# Patient Record
Sex: Male | Born: 1935 | Race: Black or African American | Hispanic: No | Smoking: Former smoker
Health system: Southern US, Community
[De-identification: ages and names within clinical notes are randomized; demographics above are authoritative.]

## PROBLEM LIST (undated history)

## (undated) DIAGNOSIS — I519 Heart disease, unspecified: Secondary | ICD-10-CM

## (undated) DIAGNOSIS — F0390 Unspecified dementia without behavioral disturbance: Secondary | ICD-10-CM

## (undated) DIAGNOSIS — N400 Enlarged prostate without lower urinary tract symptoms: Secondary | ICD-10-CM

## (undated) DIAGNOSIS — R55 Syncope and collapse: Secondary | ICD-10-CM

## (undated) DIAGNOSIS — F419 Anxiety disorder, unspecified: Secondary | ICD-10-CM

## (undated) DIAGNOSIS — S065XAA Traumatic subdural hemorrhage with loss of consciousness status unknown, initial encounter: Secondary | ICD-10-CM

## (undated) DIAGNOSIS — I4891 Unspecified atrial fibrillation: Secondary | ICD-10-CM

## (undated) DIAGNOSIS — S065X9A Traumatic subdural hemorrhage with loss of consciousness of unspecified duration, initial encounter: Secondary | ICD-10-CM

## (undated) DIAGNOSIS — A4902 Methicillin resistant Staphylococcus aureus infection, unspecified site: Secondary | ICD-10-CM

## (undated) HISTORY — DX: Heart disease, unspecified: I51.9

## (undated) HISTORY — DX: Methicillin resistant Staphylococcus aureus infection, unspecified site: A49.02

## (undated) HISTORY — DX: Syncope and collapse: R55

## (undated) HISTORY — PX: CORONARY ARTERY BYPASS GRAFT: SHX141

## (undated) HISTORY — PX: HERNIA REPAIR: SHX51

---

## 2018-04-22 ENCOUNTER — Other Ambulatory Visit: Payer: Self-pay

## 2018-04-22 ENCOUNTER — Inpatient Hospital Stay: Payer: Medicare Other

## 2018-04-22 ENCOUNTER — Emergency Department: Payer: Medicare Other

## 2018-04-22 ENCOUNTER — Encounter: Payer: Self-pay | Admitting: Emergency Medicine

## 2018-04-22 ENCOUNTER — Inpatient Hospital Stay
Admission: EM | Admit: 2018-04-22 | Discharge: 2018-04-25 | DRG: 083 | Disposition: A | Discharge status: Skilled Nursing Facility | Payer: Medicare Other | Source: Skilled Nursing Facility | Attending: Internal Medicine | Admitting: Internal Medicine

## 2018-04-22 DIAGNOSIS — F329 Major depressive disorder, single episode, unspecified: Secondary | ICD-10-CM | POA: Diagnosis present

## 2018-04-22 DIAGNOSIS — Z66 Do not resuscitate: Secondary | ICD-10-CM | POA: Diagnosis present

## 2018-04-22 DIAGNOSIS — N400 Enlarged prostate without lower urinary tract symptoms: Secondary | ICD-10-CM | POA: Diagnosis present

## 2018-04-22 DIAGNOSIS — W19XXXA Unspecified fall, initial encounter: Secondary | ICD-10-CM | POA: Diagnosis present

## 2018-04-22 DIAGNOSIS — Z7901 Long term (current) use of anticoagulants: Secondary | ICD-10-CM

## 2018-04-22 DIAGNOSIS — K5901 Slow transit constipation: Secondary | ICD-10-CM | POA: Diagnosis present

## 2018-04-22 DIAGNOSIS — Z7189 Other specified counseling: Secondary | ICD-10-CM | POA: Diagnosis not present

## 2018-04-22 DIAGNOSIS — S065X9A Traumatic subdural hemorrhage with loss of consciousness of unspecified duration, initial encounter: Principal | ICD-10-CM | POA: Diagnosis present

## 2018-04-22 DIAGNOSIS — I2510 Atherosclerotic heart disease of native coronary artery without angina pectoris: Secondary | ICD-10-CM | POA: Diagnosis present

## 2018-04-22 DIAGNOSIS — M4692 Unspecified inflammatory spondylopathy, cervical region: Secondary | ICD-10-CM | POA: Diagnosis present

## 2018-04-22 DIAGNOSIS — Z6825 Body mass index (BMI) 25.0-25.9, adult: Secondary | ICD-10-CM

## 2018-04-22 DIAGNOSIS — Z23 Encounter for immunization: Secondary | ICD-10-CM

## 2018-04-22 DIAGNOSIS — I629 Nontraumatic intracranial hemorrhage, unspecified: Secondary | ICD-10-CM

## 2018-04-22 DIAGNOSIS — R414 Neurologic neglect syndrome: Secondary | ICD-10-CM | POA: Diagnosis present

## 2018-04-22 DIAGNOSIS — Z515 Encounter for palliative care: Secondary | ICD-10-CM | POA: Diagnosis present

## 2018-04-22 DIAGNOSIS — Y92099 Unspecified place in other non-institutional residence as the place of occurrence of the external cause: Secondary | ICD-10-CM

## 2018-04-22 DIAGNOSIS — E875 Hyperkalemia: Secondary | ICD-10-CM | POA: Diagnosis present

## 2018-04-22 DIAGNOSIS — R296 Repeated falls: Secondary | ICD-10-CM | POA: Diagnosis present

## 2018-04-22 DIAGNOSIS — F03918 Unspecified dementia, unspecified severity, with other behavioral disturbance: Secondary | ICD-10-CM

## 2018-04-22 DIAGNOSIS — S01112A Laceration without foreign body of left eyelid and periocular area, initial encounter: Secondary | ICD-10-CM | POA: Diagnosis present

## 2018-04-22 DIAGNOSIS — S0990XA Unspecified injury of head, initial encounter: Secondary | ICD-10-CM

## 2018-04-22 DIAGNOSIS — F0391 Unspecified dementia with behavioral disturbance: Secondary | ICD-10-CM | POA: Diagnosis present

## 2018-04-22 DIAGNOSIS — I4891 Unspecified atrial fibrillation: Secondary | ICD-10-CM | POA: Diagnosis present

## 2018-04-22 DIAGNOSIS — N183 Chronic kidney disease, stage 3 (moderate): Secondary | ICD-10-CM | POA: Diagnosis present

## 2018-04-22 DIAGNOSIS — N179 Acute kidney failure, unspecified: Secondary | ICD-10-CM | POA: Diagnosis present

## 2018-04-22 DIAGNOSIS — R634 Abnormal weight loss: Secondary | ICD-10-CM | POA: Diagnosis present

## 2018-04-22 DIAGNOSIS — S065XAA Traumatic subdural hemorrhage with loss of consciousness status unknown, initial encounter: Secondary | ICD-10-CM

## 2018-04-22 DIAGNOSIS — S40012A Contusion of left shoulder, initial encounter: Secondary | ICD-10-CM | POA: Diagnosis present

## 2018-04-22 LAB — CBC WITH DIFFERENTIAL/PLATELET
BASOS ABS: 0 10*3/uL (ref 0–0.1)
Basophils Relative: 1 %
Eosinophils Absolute: 0.1 10*3/uL (ref 0–0.7)
Eosinophils Relative: 2 %
HEMATOCRIT: 35.5 % — AB (ref 40.0–52.0)
HEMOGLOBIN: 12 g/dL — AB (ref 13.0–18.0)
LYMPHS PCT: 19 %
Lymphs Abs: 1.2 10*3/uL (ref 1.0–3.6)
MCH: 35.8 pg — ABNORMAL HIGH (ref 26.0–34.0)
MCHC: 33.9 g/dL (ref 32.0–36.0)
MCV: 105.8 fL — AB (ref 80.0–100.0)
MONO ABS: 0.6 10*3/uL (ref 0.2–1.0)
Monocytes Relative: 9 %
NEUTROS ABS: 4.3 10*3/uL (ref 1.4–6.5)
NEUTROS PCT: 69 %
Platelets: 121 10*3/uL — ABNORMAL LOW (ref 150–440)
RBC: 3.35 MIL/uL — AB (ref 4.40–5.90)
RDW: 14 % (ref 11.5–14.5)
WBC: 6.2 10*3/uL (ref 3.8–10.6)

## 2018-04-22 LAB — BASIC METABOLIC PANEL
Anion gap: 6 (ref 5–15)
BUN: 18 mg/dL (ref 8–23)
CHLORIDE: 108 mmol/L (ref 98–111)
CO2: 27 mmol/L (ref 22–32)
Calcium: 8.8 mg/dL — ABNORMAL LOW (ref 8.9–10.3)
Creatinine, Ser: 1.77 mg/dL — ABNORMAL HIGH (ref 0.61–1.24)
GFR calc non Af Amer: 34 mL/min — ABNORMAL LOW (ref >60)
GFR, EST AFRICAN AMERICAN: 40 mL/min — AB (ref >60)
GLUCOSE: 118 mg/dL — AB (ref 70–99)
POTASSIUM: 5.2 mmol/L — AB (ref 3.5–5.1)
Sodium: 141 mmol/L (ref 135–145)

## 2018-04-22 LAB — TYPE AND SCREEN
ABO/RH(D): A POS
ANTIBODY SCREEN: NEGATIVE

## 2018-04-22 LAB — PROTIME-INR
INR: 1.11
Prothrombin Time: 14.2 seconds (ref 11.4–15.2)

## 2018-04-22 LAB — HEPARIN LEVEL (UNFRACTIONATED): HEPARIN UNFRACTIONATED: 0.81 [IU]/mL — AB (ref 0.30–0.70)

## 2018-04-22 LAB — APTT: APTT: 34 s (ref 24–36)

## 2018-04-22 MED ORDER — ATORVASTATIN CALCIUM 20 MG PO TABS
20.0000 mg | ORAL_TABLET | Freq: Every day | ORAL | Status: DC
  Administered 2018-04-23: 09:00:00 20 mg via ORAL
  Filled 2018-04-22: qty 1

## 2018-04-22 MED ORDER — HYDRALAZINE HCL 10 MG PO TABS
10.0000 mg | ORAL_TABLET | Freq: Three times a day (TID) | ORAL | Status: DC
Start: 2018-04-22 — End: 2018-04-23
  Administered 2018-04-22 – 2018-04-23 (×2): 10 mg via ORAL
  Filled 2018-04-22 (×4): qty 1

## 2018-04-22 MED ORDER — SENNOSIDES-DOCUSATE SODIUM 8.6-50 MG PO TABS: 1.0000 | ORAL_TABLET | Freq: Every evening | ORAL | Status: DC | PRN

## 2018-04-22 MED ORDER — TAMSULOSIN HCL 0.4 MG PO CAPS
0.4000 mg | ORAL_CAPSULE | Freq: Every day | ORAL | Status: DC
  Administered 2018-04-24 – 2018-04-25 (×2): 0.4 mg via ORAL
  Filled 2018-04-22 (×2): qty 1

## 2018-04-22 MED ORDER — MEMANTINE HCL ER 28 MG PO CP24
28.0000 mg | ORAL_CAPSULE | Freq: Every day | ORAL | Status: DC
  Administered 2018-04-23 – 2018-04-25 (×3): 28 mg via ORAL
  Filled 2018-04-22 (×3): qty 1

## 2018-04-22 MED ORDER — ONDANSETRON HCL 4 MG PO TABS
4.0000 mg | ORAL_TABLET | Freq: Four times a day (QID) | ORAL | Status: DC | PRN
Start: 2018-04-22 — End: 2018-04-25

## 2018-04-22 MED ORDER — AMLODIPINE BESYLATE 10 MG PO TABS
10.0000 mg | ORAL_TABLET | Freq: Every day | ORAL | Status: DC
Start: 2018-04-23 — End: 2018-04-23
  Administered 2018-04-23: 09:00:00 10 mg via ORAL
  Filled 2018-04-22: qty 1

## 2018-04-22 MED ORDER — TETANUS-DIPHTH-ACELL PERTUSSIS 5-2.5-18.5 LF-MCG/0.5 IM SUSP
0.5000 mL | Freq: Once | INTRAMUSCULAR | Status: AC
  Administered 2018-04-22: 0.5 mL via INTRAMUSCULAR
  Filled 2018-04-22: qty 0.5

## 2018-04-22 MED ORDER — HYDROCODONE-ACETAMINOPHEN 5-325 MG PO TABS: 1.0000 | ORAL_TABLET | ORAL | Status: DC | PRN

## 2018-04-22 MED ORDER — MIRTAZAPINE 15 MG PO TABS
7.5000 mg | ORAL_TABLET | Freq: Every day | ORAL | Status: DC
  Administered 2018-04-22 – 2018-04-24 (×3): 7.5 mg via ORAL
  Filled 2018-04-22 (×3): qty 1

## 2018-04-22 MED ORDER — MORPHINE SULFATE (PF) 2 MG/ML IV SOLN
2.0000 mg | INTRAVENOUS | Status: DC | PRN
  Filled 2018-04-22: qty 1

## 2018-04-22 MED ORDER — ALBUTEROL SULFATE (2.5 MG/3ML) 0.083% IN NEBU: 2.5000 mg | INHALATION_SOLUTION | RESPIRATORY_TRACT | Status: DC | PRN

## 2018-04-22 MED ORDER — ACETAMINOPHEN 325 MG PO TABS: 650.0000 mg | ORAL_TABLET | Freq: Four times a day (QID) | ORAL | Status: DC | PRN

## 2018-04-22 MED ORDER — ACETAMINOPHEN 650 MG RE SUPP: 650.0000 mg | Freq: Four times a day (QID) | RECTAL | Status: DC | PRN

## 2018-04-22 MED ORDER — ONDANSETRON HCL 4 MG/2ML IJ SOLN: 4.0000 mg | Freq: Four times a day (QID) | INTRAMUSCULAR | Status: DC | PRN

## 2018-04-22 MED ORDER — DONEPEZIL HCL 5 MG PO TABS
10.0000 mg | ORAL_TABLET | Freq: Every day | ORAL | Status: DC
Start: 2018-04-22 — End: 2018-04-25
  Administered 2018-04-22 – 2018-04-24 (×3): 10 mg via ORAL
  Filled 2018-04-22 (×4): qty 2

## 2018-04-22 MED ORDER — LORAZEPAM 2 MG/ML IJ SOLN: 1.0000 mg | INTRAMUSCULAR | Status: DC | PRN

## 2018-04-22 MED ORDER — BISACODYL 5 MG PO TBEC: 5.0000 mg | DELAYED_RELEASE_TABLET | Freq: Every day | ORAL | Status: DC | PRN

## 2018-04-22 MED ORDER — DIVALPROEX SODIUM 125 MG PO CSDR
250.0000 mg | DELAYED_RELEASE_CAPSULE | Freq: Two times a day (BID) | ORAL | Status: DC
  Administered 2018-04-22 – 2018-04-25 (×6): 250 mg via ORAL
  Filled 2018-04-22 (×7): qty 2

## 2018-04-22 MED ORDER — LORAZEPAM BOLUS VIA INFUSION: 1.0000 mg | INTRAVENOUS | Status: DC | PRN

## 2018-04-22 MED ORDER — EMPTY CONTAINERS FLEXIBLE MISC
4500.0000 [IU] | Status: AC
  Administered 2018-04-22: 4500 [IU] via INTRAVENOUS
  Filled 2018-04-22: qty 4000

## 2018-04-22 NOTE — ED Triage Notes (Signed)
Pt arrived via [facility]. Pt is from [facility]. Per EMS, pt fell at 1300 today. Laceration noted on L/eyebrown.   Hx of dementia, pt at baseline.  VSS per EMS. NAD. ED provider at bedside.

## 2018-04-22 NOTE — ED Notes (Signed)
Called to give report, they stated that room was still being clean and nurse would call back and get report.

## 2018-04-22 NOTE — Clinical Social Work Note (Addendum)
Clinical Social Work Assessment  Patient Details  Name: Keith [name] MRN: [id number] Date of Birth: [birth year]  Date of referral:  04/22/18               Reason for consult:  Facility Placement                Permission sought to share information with:  Family Supports, Customer service manager Permission granted to share information::  Yes, Verbal Permission Granted(Patient with altered mental status)  Name::     Leandra Kern  Agency::  [facility]  Relationship::  Daughter  Contact Information:  [phone]  Housing/Transportation Living arrangements for the past 2 months:  Assisted Living Facility([facility]) Source of Information:  Adult Children(Daughter -Leandra Kern) Patient Interpreter Needed:  None Criminal Activity/Legal Involvement Pertinent to Current Situation/Hospitalization:  No - Comment as needed Significant Relationships:  Adult Children Lives with:  Facility Resident Do you feel safe going back to the place where you live?  Yes Need for family participation in patient care:  Yes (Comment)(Patient with altered mental status)  Care giving concerns:  Patient lives at [facility] and needing a higher level of care.    Social Worker assessment / plan:  CSW received consult for "placement." Patient is a current resident at [facility] (ALF). Patient came in for a fall today. CSW staffed with EDP Dr. Quentin Cornwall and patient to be admitted. CSW met with patient and daughter at bedside. Patient with altered mental status and attempting to get out of bed while CSW completing assessment. Daughter states she moved patient here from [place] around May 28th or 29th and placed patient into [facility]. Daughter states patient is unable to get [facility] Medicaid until he has been a resident here for 81 months (August 28th or 29th). Daughter states she has been speaking with Marden Noble at  [facility] about patient moving there. However, patient cannot pay $3800/month privately. Currently patient receives approximately $1500/month and is paying privately at current ALF, with daughter supplementing cost of care. Daughter stated she would not be able to supplement cost of care for memory care. Daughter is the only local support to the patient and also provides support to her 81 year old autistic daughter. Daughter was visibly overwhelmed and tearful on and off throughout the assessment. Daughter concerned this may be "God calling him home" and she wants to make sure final arrangements are in order for patient. CSW provided emotional support to daughter. CSW discussed possibility of short term rehab and explained 3 night qualifying stay for Medicare to cover rehab. CSW also explained this is based on insurance approval and is not definite. Patient states if rehab is an options, she prefers [facility].   CSW provided patient with [facility], as well as secured [facility] for future reference. Patient explained unit CSW will follow patient. Daughter appreciative of CSW support and assistance. CSW updated EDP Dr. Quentin Cornwall and Orpah Clinton. Unit CSW will continue to follow for discharge needs.   Employment status:  Retired Forensic scientist:  Medicare PT Recommendations:  Not assessed at this time [place] / Referral to community resources:  [place], Other (Comment Required)(Memory Care Assisted Living)  Patient/Family's Response to care:  Patient with altered mental status. Daughter agreeable to patient plan of care for treatment.   Patient/Family's Understanding of and Emotional Response to Diagnosis, Current Treatment, and Prognosis:  Unable to assess as patient with altered mental status/Dementia.   Emotional Assessment Appearance:  Appears stated age Attitude/Demeanor/Rapport:  Other(Appropriate) Affect  (typically observed):  Quiet Orientation:  Oriented to Self Alcohol / Substance use:  Other Psych involvement (Current and /or in the community):  No (Comment)  Discharge Needs  Concerns to be addressed:  Discharge Planning Concerns Readmission within the last 30 days:  No Current discharge risk:  Cognitively Impaired Barriers to Discharge:  Continued Medical Work up   [employer], LCSW 04/22/2018, 7:51 PM

## 2018-04-22 NOTE — ED Notes (Signed)
Patient is resting comfortably. 

## 2018-04-22 NOTE — ED Notes (Signed)
Writer is now sitting with pt.

## 2018-04-22 NOTE — ED Provider Notes (Addendum)
[facility] Emergency Department Provider Note    First MD Initiated Contact with Patient 04/22/18 1413     (approximate)  I have reviewed the triage vital signs and the nursing notes.   HISTORY  Chief Complaint Fall  Level V caveat:  Dementia/AMS   HPI Keith [name] is a 83 y.o. male presents via EMS after unwitnessed fall at assisted living facility today around 1 PM.  Patient did not fall and hit the left side of his head resulting in a small laceration above the left eye.  Patient with underlying dementia reportedly behaving at his baseline.  Denies any other discomfort or pain.  Patient providing limited history.    No past medical history on file. No family history on file.  There are no active problems to display for this patient.     Prior to Admission medications   Not on File    Allergies Patient has no allergy information on record.    Social History Social History   Tobacco Use  . Smoking status: Not on file  Substance Use Topics  . Alcohol use: Not on file  . Drug use: Not on file    Review of Systems Patient denies headaches, rhinorrhea, blurry vision, numbness, shortness of breath, chest pain, edema, cough, abdominal pain, nausea, vomiting, diarrhea, dysuria, fevers, rashes or hallucinations unless otherwise stated above in HPI. ____________________________________________   PHYSICAL EXAM:  VITAL SIGNS: There were no vitals filed for this visit.  Constitutional: Alert disoriented Eyes: Conjunctivae are normal.  Head: abrasion and 1cm superficial laceration of left eye, Nose: No congestion/rhinnorhea. Mouth/Throat: Mucous membranes are moist.   Neck: No stridor. Painless ROM.  Cardiovascular: Normal rate, regular rhythm. Grossly normal heart sounds.  Good peripheral circulation. Respiratory: Normal respiratory effort.  No retractions. Lungs CTAB. Gastrointestinal: Soft and nontender. No distention. No abdominal  bruits. No CVA tenderness. Genitourinary: deferred Musculoskeletal: No lower extremity tenderness nor edema.  No joint effusions. Neurologic:   No gross focal neurologic deficits are appreciated. No facial droop Skin:  Skin is warm, dry and intact. No rash noted.   ____________________________________________   LABS (all labs ordered are listed, but only abnormal results are displayed)  Results for orders placed or performed during the hospital encounter of 04/22/18 (from the past 24 hour(s))  CBC with Differential/Platelet     Status: Abnormal   Collection Time: 04/22/18  2:33 PM  Result Value Ref Range   WBC 6.2 3.8 - 10.6 K/uL   RBC 3.35 (L) 4.40 - 5.90 MIL/uL   Hemoglobin 12.0 (L) 13.0 - 18.0 g/dL   HCT 16.135.5 (L) 09.640.0 - 04.552.0 %   MCV 105.8 (H) 80.0 - 100.0 fL   MCH 35.8 (H) 26.0 - 34.0 pg   MCHC 33.9 32.0 - 36.0 g/dL   RDW 40.914.0 81.111.5 - 91.414.5 %   Platelets 121 (L) 150 - 440 K/uL   Neutrophils Relative % 69 %   Neutro Abs 4.3 1.4 - 6.5 K/uL   Lymphocytes Relative 19 %   Lymphs Abs 1.2 1.0 - 3.6 K/uL   Monocytes Relative 9 %   Monocytes Absolute 0.6 0.2 - 1.0 K/uL   Eosinophils Relative 2 %   Eosinophils Absolute 0.1 0 - 0.7 K/uL   Basophils Relative 1 %   Basophils Absolute 0.0 0 - 0.1 K/uL   _______________________________________ ____________________________________________  RADIOLOGY  I personally reviewed all radiographic images ordered to evaluate for the above acute complaints and reviewed radiology reports and  findings.  These findings were personally discussed with the patient.  Please see medical record for radiology report.  ____________________________________________   PROCEDURES  Procedure(s) performed:  .Critical Care Performed by: Willy Eddy, MD Authorized by: Willy Eddy, MD   Critical care provider statement:    Critical care time (minutes):  40   Critical care time was exclusive of:  Separately billable procedures and treating other  patients   Critical care was necessary to treat or prevent imminent or life-threatening deterioration of the following conditions:  Trauma   Critical care was time spent personally by me on the following activities:  Development of treatment plan with patient or surrogate, discussions with consultants, evaluation of patient's response to treatment, examination of patient, obtaining history from patient or surrogate, ordering and performing treatments and interventions, ordering and review of laboratory studies, ordering and review of radiographic studies, pulse oximetry, re-evaluation of patient's condition and review of old charts  .Marland KitchenLaceration Repair Date/Time: 04/22/2018 3:17 PM Performed by: Willy Eddy, MD Authorized by: Willy Eddy, MD   Consent:    Consent obtained:  Verbal   Consent given by:  Patient   Risks discussed:  Infection, pain, retained foreign body, poor cosmetic result and poor wound healing Laceration details:    Location:  Face   Face location:  L eyebrow   Length (cm):  1   Depth (mm):  2 Repair type:    Repair type:  Simple Exploration:    Hemostasis achieved with:  Direct pressure   Wound exploration: entire depth of wound probed and visualized     Contaminated: no   Treatment:    Area cleansed with:  Saline and Hibiclens   Amount of cleaning:  Extensive   Visualized foreign bodies/material removed: no   Skin repair:    Repair method:  Tissue adhesive Approximation:    Approximation:  Close Post-procedure details:    Dressing:  Sterile dressing   Patient tolerance of procedure:  Tolerated well, no immediate complications      Critical Care performed: yes ____________________________________________   INITIAL IMPRESSION / ASSESSMENT AND PLAN / ED COURSE  Pertinent labs & imaging results that were available during my care of the patient were reviewed by me and considered in my medical decision making (see chart for details).   DDX:  sah, sdh, edh, fracture, contusion, soft tissue injury, viscous injury, concussion, hemorrhage   Keith [name] is a 83 y.o. who presents to the ED with head injury as described above after fall onto concrete flooring.  Blood work and CT imaging sent for the above differential shows evidence of acute and acute on chronic subdural hematoma with some extension in his ventricle.  Patient's presentation is complicated due to his underlying dementia as well as the fact that he is on Xarelto.  Patient does not have any lateralizing deficits but is drowsy.  Discussed case with Dr. Marcell Barlow neurosurgery who does recommend reversal with [facility] and kindly agrees to evaluate patient at bedside.  After discussion of the patient's presentation and condition with family at bedside they state that his goals of care would be primarily comfort.  Patient's presentation will discuss with hospitalist for admission.  Will order Kcentra for reversal.  Have discussed with the patient and available family all diagnostics and treatments performed thus far and all questions were answered to the best of my ability. The patient demonstrates understanding and agreement with plan.        As part of my medical  decision making, I reviewed the following data within the electronic MEDICAL RECORD NUMBER Nursing notes reviewed and incorporated, Labs reviewed, notes from prior ED visits.  ____________________________________________   FINAL CLINICAL IMPRESSION(S) / ED DIAGNOSES  Final diagnoses:  Subdural hematoma (HCC)  Injury of head, initial encounter      NEW MEDICATIONS STARTED DURING THIS VISIT:  New Prescriptions   No medications on file     Note:  This document was prepared using Dragon voice recognition software and may include unintentional dictation errors.    Willy Eddy, MD 04/22/18 1517    Willy Eddy, MD 04/22/18 [phone]

## 2018-04-22 NOTE — Consult Note (Signed)
Referring Physician:  No referring provider defined for this encounter.  Primary Physician:  Patient, No Pcp Per  Chief Complaint: Fall  History of Present Illness: Keith [name] is a 83 y.o. male who presents as a neurosurgical consult for intracranial hemorrhage after experiencing a fall.  Patient has history of severe dementia.  Daughter was in room at time of evaluation and she states he he may be slightly disoriented from his baseline since he is not responding as normally as he would towards her.  Daughter states that he has had 2 falls in the last couple of months at the facility that he resides.  Review of Systems:  A 10 point review of systems is negative, except for the pertinent positives and negatives detailed in the HPI.  Past Medical History: History reviewed. No pertinent past medical history.  Past Surgical History: History reviewed. No pertinent surgical history.  Allergies: Allergies as of 04/22/2018  . (No Known Allergies)    Medications:  Current Facility-Administered Medications:  .  prothrombin complex conc human (KCENTRA) IVPB 4,500 Units, 4,500 Units, Intravenous, STAT, Willy Eddyobinson, Patrick, MD  Current Outpatient Medications:  .  amLODipine (NORVASC) 10 MG tablet, Take 10 mg by mouth daily., Disp: , Rfl:  .  ammonium lactate (LAC-HYDRIN) 12 % lotion, Apply 1 application topically daily as needed for dry skin (LEG WRAPPING)., Disp: , Rfl:  .  atorvastatin (LIPITOR) 20 MG tablet, Take 20 mg by mouth daily., Disp: , Rfl:  .  divalproex (DEPAKOTE SPRINKLE) 125 MG capsule, Take 250 mg by mouth 2 (two) times daily., Disp: , Rfl:  .  donepezil (ARICEPT) 10 MG tablet, Take 10 mg by mouth at bedtime., Disp: , Rfl:  .  hydrALAZINE (APRESOLINE) 10 MG tablet, Take 10 mg by mouth 3 (three) times daily., Disp: , Rfl:  .  lanolin-mineral oil (BABY OIL) OIL, Apply topically 2 (two) times daily., Disp: , Rfl:  .  LORazepam (ATIVAN) 0.5 MG tablet, Take 0.5 mg by mouth every  6 (six) hours as needed for anxiety (AGITATION CONTROL)., Disp: , Rfl:  .  memantine (NAMENDA XR) 28 MG CP24 24 hr capsule, Take 28 mg by mouth daily., Disp: , Rfl:  .  mirtazapine (REMERON) 7.5 MG tablet, Take 7.5 mg by mouth at bedtime., Disp: , Rfl:  .  Multiple Vitamin (MULTIVITAMIN) tablet, Take 1 tablet by mouth daily., Disp: , Rfl:  .  polyethylene glycol (MIRALAX / GLYCOLAX) packet, Take 17 g by mouth daily., Disp: , Rfl:  .  rivaroxaban (XARELTO) 20 MG TABS tablet, Take 20 mg by mouth daily with supper., Disp: , Rfl:  .  tamsulosin (FLOMAX) 0.4 MG CAPS capsule, Take 0.4 mg by mouth daily after supper., Disp: , Rfl:    Social History: Social History   Tobacco Use  . Smoking status: Unknown If Ever Smoked  . Smokeless tobacco: Never Used  Substance Use Topics  . Alcohol use: Not Currently  . Drug use: Not Currently    Family Medical History: History reviewed. No pertinent family history.  Physical Examination: Vitals:   04/22/18 1418  BP: (!) 138/91  Pulse: 87  Temp: 98.9 F (37.2 C)  SpO2: 97%     General: Patient is well developed, well nourished, calm, collected, and in no apparent distress.  Psychiatric: Patient is non-anxious.  Head:  Pupils equal, round, and reactive to light.  ENT:  Oral mucosa appears well hydrated.  Respiratory: Patient is breathing without any difficulty.  Extremities: No edema.  Vascular: Palpable pulses in dorsal pedal vessels.  Skin:   On exposed skin, there are no abnormal skin lesions.  NEUROLOGICAL:  General: In no acute distress. Sleeping. Unable to answer questions or focus on obeying commands. Per daughter, this is baseline. Awakens easily.  Facial toe is symmetric.    Strength: Side Biceps Triceps Grip  R 5 5 5   L 5 5 5   Unable to obtain additional strength testing.  Reflexes are 2+ and symmetric at the biceps, triceps, brachioradialis, patella and achilles.  Clonus is not present. Toes are down-going.   Hoffman's  is absent.  Imaging: EXAM: CT HEAD WITHOUT CONTRAST  CT CERVICAL SPINE WITHOUT CONTRAST  TECHNIQUE: Multidetector CT imaging of the head and cervical spine was performed following the standard protocol without intravenous contrast. Multiplanar CT image reconstructions of the cervical spine were also generated.  COMPARISON:  None.  FINDINGS: CT HEAD FINDINGS  Brain: The patient has bilateral extra-axial fluid collections. Both collections have mixed attenuation within them consistent with acute or subacute on chronic subdural hematomas. The collection on the right is larger measuring up to 1.7 cm in thickness compared to the collection on the left which is up to 0.9 cm in thickness. No left to right midline shift is identified. Acute subdural hemorrhage is seen over the tentorium bilaterally, greater in volume on the left. A small amount of hemorrhage is also seen layering in the posterior horns of the lateral ventricles. The brain is atrophic with chronic microvascular ischemic change. No hydrocephalus or pneumocephalus.  Vascular: Atherosclerosis is noted.  Skull: Intact.  No focal lesion.  Sinuses/Orbits: Mild mucosal thickening right ethmoid air cells is identified.  Other: None.  CT CERVICAL SPINE FINDINGS  Alignment: Maintained with straightening of lordosis noted.  Skull base and vertebrae: No acute fracture. No primary bone lesion or focal pathologic process.  Soft tissues and spinal canal: No prevertebral fluid or swelling. No visible canal hematoma.  Disc levels: Intervertebral disc space height is maintained. Multilevel facet arthropathy is noted.  Upper chest: Lung apices clear.  Other: None.  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 Acute subdural blood is also seen layering over the tentorium bilaterally, greater on the right, and there is a small volume of hemorrhage within the  ventricular system.  Negative for hydrocephalus or midline shift.  No acute abnormality cervical spine.  Atrophy and chronic microvascular ischemic change.  Atherosclerosis.  Multilevel facet arthropathy cervical spine.  Critical Value/emergent results were called by telephone at the time of interpretation on 04/22/2018 at 3:03 pm to Dr. Willy Eddy , who verbally acknowledged these results.   Assessment and Plan: Mr. Maselli is a pleasant 83 y.o. 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  Dr. Marcell Barlow was consulted and recommended Kcentra for Xarelto reversal and repeat head CT 6 hours following initial CT imaging.  Patient currently DNR and on comfort measures.  Recommend admission until able to find placement in more appropriate care facility.  Ivar Drape, PA-C Dept. of Neurosurgery

## 2018-04-22 NOTE — ED Notes (Signed)
Sitter at bedside.

## 2018-04-22 NOTE — ED Notes (Signed)
RN called pharmacy to request medications. [place]

## 2018-04-22 NOTE — ED Notes (Signed)
Family is leaving the room and requesting a sitter. Pt is sleeping but when awake pt is a high fall risk, confused and has been attempting to climb out of bed. Sitter requested.

## 2018-04-22 NOTE — H&P (Addendum)
[facility] [place] [facility]   PATIENT NAME: Keith GaussJoe [name]    MR#:  [id number]  DATE OF BIRTH:  1935/04/23  DATE OF ADMISSION:  04/22/2018  PRIMARY CARE PHYSICIAN: Patient, No Pcp Per   REQUESTING/REFERRING PHYSICIAN: Dionne BucySiadecki, Sebastian, MD  CHIEF COMPLAINT:   Chief Complaint  Patient presents with  . Fall   Fall today. HISTORY OF PRESENT ILLNESS:  Keith [name]  is a 83 y.o. male with a known history of A. fib, CAD and dementia.  The patient fell at assisted living at 1 PM today.  He hit the left side of the head resulting in a small laceration above the left eye.  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 Acute subdural blood is also seen layering over the tentorium bilaterally, greater on the right, and there is a small volume of hemorrhage within the ventricular system. Dr. Marcell BarlowYarborough was consulted and recommended Kcentra for Xarelto reversal and repeat head CT 6 hours following initial CT imaging. PAST MEDICAL HISTORY:  History reviewed. No pertinent past medical history.  A. fib, CAD and dementia.  PAST SURGICAL HISTORY:  History reviewed. No pertinent surgical history.  Hernia repair.  SOCIAL HISTORY:   Social History   Tobacco Use  . Smoking status: Unknown If Ever Smoked  . Smokeless tobacco: Never Used  Substance Use Topics  . Alcohol use: Not Currently    FAMILY HISTORY:  History reviewed. No pertinent family history.  Unknown.  DRUG ALLERGIES:  No Known Allergies  REVIEW OF SYSTEMS:   Review of Systems  Unable to perform ROS: Dementia    MEDICATIONS AT HOME:   Prior to Admission medications   Medication Sig Start Date End Date Taking? Authorizing Provider  amLODipine (NORVASC) 10 MG tablet Take 10 mg by mouth daily.   Yes [provider]  ammonium lactate (LAC-HYDRIN) 12 % lotion Apply 1 application topically daily as needed for dry skin (LEG WRAPPING).   Yes [provider]  atorvastatin (LIPITOR) 20 MG tablet Take 20 mg by mouth daily.   Yes [provider]  divalproex (DEPAKOTE SPRINKLE) 125 MG capsule Take 250 mg by mouth 2 (two) times daily.   Yes [provider]  donepezil (ARICEPT) 10 MG tablet Take 10 mg by mouth at bedtime.   Yes [provider]  hydrALAZINE (APRESOLINE) 10 MG tablet Take 10 mg by mouth 3 (three) times daily.   Yes [provider]  lanolin-mineral oil (BABY OIL) OIL Apply topically 2 (two) times daily.   Yes [provider]  LORazepam (ATIVAN) 0.5 MG tablet Take 0.5 mg by mouth every 6 (six) hours as needed for anxiety (AGITATION CONTROL).   Yes [provider]  memantine (NAMENDA XR) 28 MG CP24 24 hr capsule Take 28 mg by mouth daily.   Yes [provider]  mirtazapine (REMERON) 7.5 MG tablet Take 7.5 mg by mouth at bedtime.   Yes [provider]  Multiple Vitamin (MULTIVITAMIN) tablet Take 1 tablet by mouth daily.   Yes [provider]  polyethylene glycol (MIRALAX / GLYCOLAX) packet Take 17 g by mouth daily.   Yes [provider]  rivaroxaban (XARELTO) 20 MG TABS tablet Take 20 mg by mouth daily with supper.   Yes [provider]  tamsulosin (FLOMAX) 0.4 MG CAPS capsule Take 0.4 mg by mouth daily after supper.   Yes [provider]      VITAL SIGNS:  Blood pressure (!) 138/91, pulse 87, temperature 98.9 F (37.2 C), temperature source Axillary, weight 207 lb 4 oz (94 kg), SpO2 97 %.  PHYSICAL EXAMINATION:  Physical Exam  GENERAL:  83 y.o.-year-old patient lying in the bed with no acute distress.   EYES: Pupils equal, round, reactive to light and accommodation. No scleral icterus.  HEENT: Laceration on the left side of the head. NECK:  Supple, no jugular venous distention. No thyroid enlargement, no tenderness.  LUNGS: Normal breath sounds bilaterally, no wheezing, rales,rhonchi or crepitation. No use of  accessory muscles of respiration.  CARDIOVASCULAR: S1, S2 normal. No murmurs, rubs, or gallops.  ABDOMEN: Soft, nontender, nondistended. Bowel sounds present. No organomegaly or mass.  EXTREMITIES: No pedal edema, cyanosis, or clubbing.  NEUROLOGIC: Unable to exam. PSYCHIATRIC: The patient is unresponsive. SKIN: Bruises on bilateral legs and feet.  LABORATORY PANEL:   CBC Recent Labs  Lab 04/22/18 1433  WBC 6.2  HGB 12.0*  HCT 35.5*  PLT 121*   ------------------------------------------------------------------------------------------------------------------  Chemistries  Recent Labs  Lab 04/22/18 1433  NA 141  K 5.2*  CL 108  CO2 27  GLUCOSE 118*  BUN 18  CREATININE 1.77*  CALCIUM 8.8*   ------------------------------------------------------------------------------------------------------------------  Cardiac Enzymes No results for input(s): TROPONINI in the last 168 hours. ------------------------------------------------------------------------------------------------------------------  RADIOLOGY:  Ct Head Wo Contrast  Result Date: 04/22/2018 CLINICAL DATA:  Status post fall at 1 p.m. today. Laceration above the left eye. EXAM: CT HEAD WITHOUT CONTRAST CT CERVICAL SPINE WITHOUT CONTRAST TECHNIQUE: Multidetector CT imaging of the head and cervical spine was performed following the standard protocol without intravenous contrast. Multiplanar CT image reconstructions of the cervical spine were also generated. COMPARISON:  None. FINDINGS: CT HEAD FINDINGS Brain: The patient has bilateral extra-axial fluid collections. Both collections have mixed attenuation within them consistent with acute or subacute on chronic subdural hematomas. The collection on the right is larger measuring up to 1.7 cm in thickness compared to the collection on the left which is up to 0.9 cm in thickness. No left to right midline shift is identified. Acute subdural hemorrhage is seen over the tentorium  bilaterally, greater in volume on the left. A small amount of hemorrhage is also seen layering in the posterior horns of the lateral ventricles. The brain is atrophic with chronic microvascular ischemic change. No hydrocephalus or pneumocephalus. Vascular: Atherosclerosis is noted. Skull: Intact.  No focal lesion. Sinuses/Orbits: Mild mucosal thickening right ethmoid air cells is identified. Other: None. CT CERVICAL SPINE FINDINGS Alignment: Maintained with straightening of lordosis noted. Skull base and vertebrae: No acute fracture. No primary bone lesion or focal pathologic process. Soft tissues and spinal canal: No prevertebral fluid or swelling. No visible canal hematoma. Disc levels: Intervertebral disc space height is maintained. Multilevel facet arthropathy is noted. Upper chest: Lung apices clear. Other: None. 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 Acute subdural blood is also seen layering over the tentorium bilaterally, greater on the right, and there is a small volume of hemorrhage within the ventricular system. Negative for hydrocephalus or midline shift. No acute abnormality cervical spine. Atrophy and chronic microvascular ischemic change. Atherosclerosis. Multilevel facet arthropathy cervical spine. Critical Value/emergent results were called by telephone at the time of interpretation on 04/22/2018 at 3:03 pm to Dr. Willy Eddy , who verbally acknowledged these results. Electronically Signed   By: Drusilla Kanner M.D.   On: 04/22/2018 15:06   Ct Cervical Spine Wo Contrast  Result Date: 04/22/2018 CLINICAL DATA:  Status post fall at 1 p.m. today. Laceration above the left eye. EXAM: CT HEAD WITHOUT CONTRAST CT CERVICAL SPINE WITHOUT CONTRAST TECHNIQUE: Multidetector CT imaging of the head and cervical spine was performed following the standard protocol without intravenous contrast. Multiplanar CT image reconstructions of the cervical spine were also  generated. COMPARISON:  None. FINDINGS: CT HEAD FINDINGS Brain: The patient has bilateral extra-axial fluid collections. Both collections have mixed attenuation within them consistent with acute or subacute on chronic subdural hematomas. The collection on the right is larger measuring up to 1.7 cm in thickness compared to the collection on the left which is up to 0.9 cm in thickness. No left to right midline shift is identified. Acute subdural hemorrhage is seen over the tentorium bilaterally, greater in volume on the left. A small amount of hemorrhage is also seen layering in the posterior horns of the lateral ventricles. The brain is atrophic with chronic microvascular ischemic change. No hydrocephalus or pneumocephalus. Vascular: Atherosclerosis is noted. Skull: Intact.  No focal lesion. Sinuses/Orbits: Mild mucosal thickening right ethmoid air cells is identified. Other: None. CT CERVICAL SPINE FINDINGS Alignment: Maintained with straightening of lordosis noted. Skull base and vertebrae: No acute fracture. No primary bone lesion or focal pathologic process. Soft tissues and spinal canal: No prevertebral fluid or swelling. No visible canal hematoma. Disc levels: Intervertebral disc space height is maintained. Multilevel facet arthropathy is noted. Upper chest: Lung apices clear. Other: None. 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 Acute subdural blood is also seen layering over the tentorium bilaterally, greater on the right, and there is a small volume of hemorrhage within the ventricular system. Negative for hydrocephalus or midline shift. No acute abnormality cervical spine. Atrophy and chronic microvascular ischemic change. Atherosclerosis. Multilevel facet arthropathy cervical spine. Critical Value/emergent results were called by telephone at the time of interpretation on 04/22/2018 at 3:03 pm to Dr. Willy Eddy , who verbally acknowledged these results.  Electronically Signed   By: Drusilla Kanner M.D.   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 The patient will be admitted to medical floor. Hold Xarelto, given thrombin 1 dose. Dr. Marcell Barlow was consulted and recommended Kcentra for Xarelto reversal and repeat head CT 6 hours following initial CT imaging.  History of A. fib and CAD. Hold Xarelto.  ARF on CKD. Mild hyperkalemia. Dementia. Aspiration precaution. The patient has very poor prognosis and may die soon.  I discussed with the patient daughter, who agreed to comfort care.  All the records are reviewed and case discussed with ED provider. Management plans discussed with the patient's daughter and they are in agreement.  CODE STATUS: DNR.  TOTAL TIME TAKING CARE OF THIS PATIENT: 28 minutes.    Shaune Pollack M.D on 04/22/2018 at 5:58 PM  Between 7am to 6pm - Pager - [phone]  After 6pm go to [email or web address] - Social research officer, government  [facility] [place] [facility]  Office  [phone]  CC: Primary care physician; Patient, No Pcp Per   Note: This dictation was prepared with Dragon dictation along with smaller phrase technology. Any transcriptional errors that result from this process are unin

## 2018-04-22 NOTE — ED Notes (Signed)
Patient transported to CT 

## 2018-04-22 NOTE — ED Provider Notes (Signed)
-----------------------------------------   5:30 PM on 04/22/2018 -----------------------------------------  I took over care of this patient from Dr. Roxan Hockeyobinson.  The patient has an intracranial hemorrhage although he is DNR and primarily comfort care.  The plan was to follow-up on neurosurgery recommendations and then admit to the hospitalist.  I discussed with the neurosurgery PA who evaluated the patient and recommends repeat CT scan in 6 hours and agrees with him being admitted here as opposed to being transferred.  I signed the patient out to the hospitalist Dr. Katheren ShamsSalary.  Dionne BucySiadecki, Gurnoor Sloop, MD 04/22/18 1731

## 2018-04-22 NOTE — ED Notes (Signed)
Family at bedside. 

## 2018-04-22 NOTE — Progress Notes (Signed)
MEDICATION RELATED CONSULT NOTE - INITIAL   Pharmacy Consult for post KCentra/FEIBA/Praxbind pharmacy monitoring Indication: KCentra  No Known Allergies  Patient Measurements: Weight: 207 lb 4 oz (94 kg) Adjusted Body Weight:   Vital Signs: Temp: 98.9 F (37.2 C) (07/30 1418) Temp Source: Axillary (07/30 1418) BP: 138/91 (07/30 1418) Pulse Rate: 87 (07/30 1418) Intake/Output from previous day: No intake/output data recorded. Intake/Output from this shift: No intake/output data recorded.  Labs: Recent Labs    04/22/18 1433  WBC 6.2  HGB 12.0*  HCT 35.5*  PLT 121*  CREATININE 1.77*   CrCl cannot be calculated (Unknown ideal weight.).   Microbiology: No results found for this or any previous visit (from the past 720 hour(s)).  Medical History: History reviewed. No pertinent past medical history.  Medications:  Infusions:  . prothrombin complex conc human (Kcentra) IVPB      Assessment: 83 yom cc fall at ALF around 1 PM. Acute and acute on chronic SDH on CT. Patient takes Xarelto PTA. Neurosurgery recommends reversal of Xarelto with KCentra. Pharmacy consulted to monitor post KCentra dosing.   Goal of Therapy:  Reverse AC, resolve SDH  Plan:  KCentra 50 units/kg (4500 units) IV x 1. Pharmacy will follow CBCs daily (no need for scheduled INR or aPTT).   Carola FrostNathan A Ailine Hefferan, Pharm.D., BCPS Clinical Pharmacist 04/22/2018,3:52 PM

## 2018-04-22 NOTE — Progress Notes (Signed)
Advanced Care Plan.  Purpose of Encounter: Comfort care. Parties in Attendance: The patient, her daughter and me. Patient's Decisional Capacity: No. Medical Story: Keith [name]  is a 83 y.o. male with a known history of A. fib, CAD and dementia.  The patient fell at assisted living at 1 PM today.  He hit the left side of the head resulting in a small laceration above the left eye.  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  I discussed with the patient's daughter about the patient's very poor prognosis, CODE STATUS and comfort care.  His daughter agrees to comfort care. Goals of Care Determinations: Comfort care. Plan:  Code Status: DNR. Time spent discussing advance care planning: 17 minutes.

## 2018-04-23 LAB — CBC
HCT: 32.6 % — ABNORMAL LOW (ref 40.0–52.0)
Hemoglobin: 11.1 g/dL — ABNORMAL LOW (ref 13.0–18.0)
MCH: 35 pg — AB (ref 26.0–34.0)
MCHC: 34 g/dL (ref 32.0–36.0)
MCV: 103 fL — ABNORMAL HIGH (ref 80.0–100.0)
PLATELETS: 123 10*3/uL — AB (ref 150–440)
RBC: 3.16 MIL/uL — ABNORMAL LOW (ref 4.40–5.90)
RDW: 13.6 % (ref 11.5–14.5)
WBC: 4.7 10*3/uL (ref 3.8–10.6)

## 2018-04-23 LAB — POTASSIUM: Potassium: 4.6 mmol/L (ref 3.5–5.1)

## 2018-04-23 LAB — MRSA PCR SCREENING: MRSA by PCR: NEGATIVE

## 2018-04-23 NOTE — Progress Notes (Signed)
Dr. Imogene Burnhen returned page after 3 attempts. MD stating that he is not taking care of the patient; however, he is listed as the attending. AC made aware of above. Bo McclintockBrewer,Sailor Hevia S, RN

## 2018-04-23 NOTE — Progress Notes (Signed)
No charge note:   Palliative consult received-  Meeting set for 8/1 at 1pm with patient's daughter.   Ocie BobKasie Mahan, AGNP-C Palliative Medicine  Please call Palliative Medicine team phone with any questions [phone]. For individual providers please see AMION.

## 2018-04-23 NOTE — Progress Notes (Signed)
Daughter arrived to patients room at 0730a requesting to speak to Dr. Imogene Burnhen as she has many questions regarding patient status, plan of care, and meaning of comfort care. MD paged.  [facility]; therefore, no devices should be removed if patient were to expire.   At current time 8:56 AM, still awaiting MD to return page.   Bo McclintockBrewer,Arlow Spiers S, RN

## 2018-04-23 NOTE — Progress Notes (Signed)
Dr. Amado CoeGouru now listed as attending. Paged, waiting on response. Bo McclintockBrewer,Keirsten Matuska S, RN

## 2018-04-23 NOTE — Progress Notes (Signed)
[employer] - [place] [facility]   PATIENT NAME: Keith GaussJoe [name]    MR#:  [id number]  DATE OF BIRTH:  1935/06/02  SUBJECTIVE:  CHIEF COMPLAINT:   Chief Complaint  Patient presents with  . Fall  alert but not oriented REVIEW OF SYSTEMS:  ROS: unobtainable due to mental state DRUG ALLERGIES:  No Known Allergies VITALS:  Blood pressure 132/82, pulse 78, temperature 100.1 F (37.8 C), temperature source Axillary, resp. rate 18, height 6\' 2"  (1.88 m), weight 91.1 kg (200 lb 12.8 oz), SpO2 96 %. PHYSICAL EXAMINATION:  Physical Exam  HENT:  Head: Normocephalic and atraumatic.  Eyes: Pupils are equal, round, and reactive to light. Conjunctivae and EOM are normal.  Neck: Normal range of motion. Neck supple. No tracheal deviation present. No thyromegaly present.  Cardiovascular: Normal rate, regular rhythm and normal heart sounds.  Pulmonary/Chest: Effort normal and breath sounds normal. No respiratory distress. He has no wheezes. He exhibits no tenderness.  Abdominal: Soft. Bowel sounds are normal. He exhibits no distension. There is no tenderness.  Musculoskeletal: Normal range of motion.  Neurological: He is alert. He is disoriented. No cranial nerve deficit.  Skin: Skin is warm and dry. No rash noted.   LABORATORY PANEL:  Male CBC Recent Labs  Lab 04/23/18 0436  WBC 4.7  HGB 11.1*  HCT 32.6*  PLT 123*   ------------------------------------------------------------------------------------------------------------------ Chemistries  Recent Labs  Lab 04/22/18 1433  NA 141  K 5.2*  CL 108  CO2 27  GLUCOSE 118*  BUN 18  CREATININE 1.77*  CALCIUM 8.8*   RADIOLOGY:  Ct Head Wo Contrast  Result Date: 04/22/2018 CLINICAL DATA:  Intracranial hemorrhage follow up EXAM: CT HEAD WITHOUT CONTRAST TECHNIQUE: Contiguous axial images were obtained from the base of the skull through the vertex without intravenous contrast. COMPARISON:  04/22/2018 at 14:38  FINDINGS: Brain: Bilateral mixed density subdural collections are unchanged in size, measuring 14 mm on the right and 8 mm on the left. There is no midline shift. Blood is again seen layering along the falx cerebri and within the occipital horns of both lateral ventricles. There is confluent white matter hypoattenuation. No new parenchymal abnormality. Vascular: There is carotid atherosclerotic calcification at the skull base. No hyperdense vessel. Skull: Normal Sinuses/Orbits: Visualized paranasal sinuses are unremarkable. No mastoid effusion. Normal orbits. 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 Unchanged volume of intraventricular blood. Electronically Signed   By: Deatra RobinsonKevin  Herman M.D.   On: 04/22/2018 22:26   Ct Head Wo Contrast  Result Date: 04/22/2018 CLINICAL DATA:  Status post fall at 1 p.m. today. Laceration above the left eye. EXAM: CT HEAD WITHOUT CONTRAST CT CERVICAL SPINE WITHOUT CONTRAST TECHNIQUE: Multidetector CT imaging of the head and cervical spine was performed following the standard protocol without intravenous contrast. Multiplanar CT image reconstructions of the cervical spine were also generated. COMPARISON:  None. FINDINGS: CT HEAD FINDINGS Brain: The patient has bilateral extra-axial fluid collections. Both collections have mixed attenuation within them consistent with acute or subacute on chronic subdural hematomas. The collection on the right is larger measuring up to 1.7 cm in thickness compared to the collection on the left which is up to 0.9 cm in thickness. No left to right midline shift is identified. Acute subdural hemorrhage is seen over the tentorium bilaterally, greater in volume on the left. A small amount of hemorrhage is also seen layering in the posterior horns of the lateral ventricles.  The brain is atrophic with chronic microvascular ischemic change. No hydrocephalus or pneumocephalus. Vascular:  Atherosclerosis is noted. Skull: Intact.  No focal lesion. Sinuses/Orbits: Mild mucosal thickening right ethmoid air cells is identified. Other: None. CT CERVICAL SPINE FINDINGS Alignment: Maintained with straightening of lordosis noted. Skull base and vertebrae: No acute fracture. No primary bone lesion or focal pathologic process. Soft tissues and spinal canal: No prevertebral fluid or swelling. No visible canal hematoma. Disc levels: Intervertebral disc space height is maintained. Multilevel facet arthropathy is noted. Upper chest: Lung apices clear. Other: None. 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 Acute subdural blood is also seen layering over the tentorium bilaterally, greater on the right, and there is a small volume of hemorrhage within the ventricular system. Negative for hydrocephalus or midline shift. No acute abnormality cervical spine. Atrophy and chronic microvascular ischemic change. Atherosclerosis. Multilevel facet arthropathy cervical spine. Critical Value/emergent results were called by telephone at the time of interpretation on 04/22/2018 at 3:03 pm to Dr. Willy Eddy , who verbally acknowledged these results. Electronically Signed   By: Drusilla Kanner M.D.   On: 04/22/2018 15:06   Ct Cervical Spine Wo Contrast  Result Date: 04/22/2018 CLINICAL DATA:  Status post fall at 1 p.m. today. Laceration above the left eye. EXAM: CT HEAD WITHOUT CONTRAST CT CERVICAL SPINE WITHOUT CONTRAST TECHNIQUE: Multidetector CT imaging of the head and cervical spine was performed following the standard protocol without intravenous contrast. Multiplanar CT image reconstructions of the cervical spine were also generated. COMPARISON:  None. FINDINGS: CT HEAD FINDINGS Brain: The patient has bilateral extra-axial fluid collections. Both collections have mixed attenuation within them consistent with acute or subacute on chronic subdural hematomas. The collection on the  right is larger measuring up to 1.7 cm in thickness compared to the collection on the left which is up to 0.9 cm in thickness. No left to right midline shift is identified. Acute subdural hemorrhage is seen over the tentorium bilaterally, greater in volume on the left. A small amount of hemorrhage is also seen layering in the posterior horns of the lateral ventricles. The brain is atrophic with chronic microvascular ischemic change. No hydrocephalus or pneumocephalus. Vascular: Atherosclerosis is noted. Skull: Intact.  No focal lesion. Sinuses/Orbits: Mild mucosal thickening right ethmoid air cells is identified. Other: None. CT CERVICAL SPINE FINDINGS Alignment: Maintained with straightening of lordosis noted. Skull base and vertebrae: No acute fracture. No primary bone lesion or focal pathologic process. Soft tissues and spinal canal: No prevertebral fluid or swelling. No visible canal hematoma. Disc levels: Intervertebral disc space height is maintained. Multilevel facet arthropathy is noted. Upper chest: Lung apices clear. Other: None. 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 Acute subdural blood is also seen layering over the tentorium bilaterally, greater on the right, and there is a small volume of hemorrhage within the ventricular system. Negative for hydrocephalus or midline shift. No acute abnormality cervical spine. Atrophy and chronic microvascular ischemic change. Atherosclerosis. Multilevel facet arthropathy cervical spine. Critical Value/emergent results were called by telephone at the time of interpretation on 04/22/2018 at 3:03 pm to Dr. Willy Eddy , who verbally acknowledged these results. Electronically Signed   By: Drusilla Kanner M.D.   On: 04/22/2018 15:06   ASSESSMENT AND PLAN:  72 y m with baseline advance dementia, history of A. fib, CAD who suffered closed head injury after a fall.    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 - Appreciate Neurosurgery input - Hold Xarelto, given thrombin 1 dose. - No further imaging needed unless significant change in neurological status. OK to check neuro status at normal unit standard (q6-8) at this point given stable exam and HCT per Neurosurgery - He is not a surgical candidate due to advanced dementia and medical comorbidities.  - He will need to be off blood thinners for at least 2 weeks per neurosurgery - [facility] Neurosurgery: call  [phone] for appointment  * History of A. fib and CAD. Hold Xarelto. Rate controlled  * ARF on CKD 3.  * Mild hyperkalemia: recheck K   The patient has very poor prognosis.  I've discussed with the patient daughter. She is in agreement for long term placement and Hospice if he qualifies.  Await Palliative care eval and d/w family to see if they would want comfort care.       All the records are reviewed and case discussed with Care Management/Social Worker. Management plans discussed with the patient, family (daughter over phone) and they are in agreement.  CODE STATUS: DNR  TOTAL TIME TAKING CARE OF THIS PATIENT: 35 minutes.   More than 50% of the time was spent in counseling/coordination of care: YES  POSSIBLE D/C IN 1-2 DAYS, DEPENDING ON CLINICAL CONDITION.   Delfino Lovett M.D on 04/23/2018 at 1:51 PM  Between 7am to 6pm - Pager - [phone]  After 6pm go to [email or web address] - Social research officer, government  [facility] [place] [facility]  Office  [phone]  CC: Primary care physician; Patient, No Pcp Per  Note: This dictation was prepared with Dragon dictation along with smaller phrase technology. Any transcriptional errors that result from this process are unintentional.

## 2018-04-23 NOTE — Progress Notes (Signed)
Dr. Sherryll BurgerShah made aware of previous notes. Dr. Sherryll BurgerShah to assume care at this time. Bo McclintockBrewer,Brigett Estell S, RN

## 2018-04-23 NOTE — Consult Note (Signed)
Please see consult note dated yesterday.  Since yesterday, [name] has had a repeat head CT, which is stable.  He has confusion at baseline, and sundowns at night.  Per report of his family, he is at his baseline.  Vitals:   04/23/18 0745 04/23/18 1205  BP: 140/81 132/82  Pulse: 72 78  Resp: 18 18  Temp: (!) 97 F (36.1 C) 100.1 F (37.8 C)  SpO2: 100% 96%   Alert, answers questions inappropriately.  Oriented to self, 1980's, and "I'm at home"  Does not follow commands well.  PERRL. EOMI. Facial tone symmetric.  Will not protrude tongue.  Does not cooperate with drift, but has at least 4/5 strength throughout symmetrically.  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 Unchanged volume of intraventricular blood.   Electronically Signed   By: Deatra RobinsonKevin  Herman M.D.   On: 04/22/2018 22:26  Labs reviewed - coags within normal parameters  A/P) 83 yo male with baseline dementia who suffered closed head injury after a fall.  He is at his baseline.  - No further imaging needed unless significant change in neurological status. OK to check neuro status at normal unit standard (q6-8) at this point given stable exam and HCT>  - He is not a surgical candidate due to advanced dementia and medical comorbidities.  His medical doctors should discuss whether blood thinning is indicated at this point given his risk of falls.  He will need to be off blood thinners for at least 2 weeks  - He can [facility] at [facility].  [phone] for appointment  Keith [name] Keith Pinho MD

## 2018-04-23 NOTE — Progress Notes (Signed)
Patient admitted on the floor last night with comfort measures ordered. Once daughter arrived, I explained to her that her dad had  comfort measures in place, she didn't understand and thought her dad would only be here for a few days and then be placed in another facility for rehab. Daughter request  to have comfort measures to be reversed. MD  notified of daughters request.

## 2018-04-23 NOTE — Clinical Social Work Note (Signed)
CSW spoke with patient's daughter [name] at bedside. Daughter states that she would like patient to go to SNF. CSW explained that patient would need to be able to do therapy in order to have insurance pay for SNF. Daughter states that patient can do therapy and she will not allow him to return to [facility]. CSW explained that therapy would evaluate him and give their recommendation. CSW explained that if patient is not appropriate for SNF rehab that he could go and pay privately for SNF, go back to ALF or go home with her. Daughter states that the only option is for patient to go to SNF for rehab because she is unable to care for him and unable to pay privately. CSW will continue to follow for discharge planning.   Ruthe Mannanandace Claira Jeter MSW, [address]CSWA [phone]

## 2018-04-24 DIAGNOSIS — F0391 Unspecified dementia with behavioral disturbance: Secondary | ICD-10-CM

## 2018-04-24 DIAGNOSIS — K5901 Slow transit constipation: Secondary | ICD-10-CM

## 2018-04-24 DIAGNOSIS — Z515 Encounter for palliative care: Secondary | ICD-10-CM

## 2018-04-24 DIAGNOSIS — Z7189 Other specified counseling: Secondary | ICD-10-CM

## 2018-04-24 DIAGNOSIS — F03918 Unspecified dementia, unspecified severity, with other behavioral disturbance: Secondary | ICD-10-CM

## 2018-04-24 LAB — BASIC METABOLIC PANEL
Anion gap: 6 (ref 5–15)
BUN: 23 mg/dL (ref 8–23)
CHLORIDE: 107 mmol/L (ref 98–111)
CO2: 27 mmol/L (ref 22–32)
CREATININE: 1.77 mg/dL — AB (ref 0.61–1.24)
Calcium: 8.3 mg/dL — ABNORMAL LOW (ref 8.9–10.3)
GFR calc Af Amer: 40 mL/min — ABNORMAL LOW (ref >60)
GFR calc non Af Amer: 34 mL/min — ABNORMAL LOW (ref >60)
GLUCOSE: 99 mg/dL (ref 70–99)
Potassium: 4.6 mmol/L (ref 3.5–5.1)
Sodium: 140 mmol/L (ref 135–145)

## 2018-04-24 LAB — CBC
HCT: 30.7 % — ABNORMAL LOW (ref 40.0–52.0)
HEMOGLOBIN: 10.4 g/dL — AB (ref 13.0–18.0)
MCH: 35 pg — AB (ref 26.0–34.0)
MCHC: 33.9 g/dL (ref 32.0–36.0)
MCV: 103.3 fL — AB (ref 80.0–100.0)
PLATELETS: 112 10*3/uL — AB (ref 150–440)
RBC: 2.97 MIL/uL — ABNORMAL LOW (ref 4.40–5.90)
RDW: 13.5 % (ref 11.5–14.5)
WBC: 5.1 10*3/uL (ref 3.8–10.6)

## 2018-04-24 MED ORDER — TRAMADOL HCL 50 MG PO TABS
50.0000 mg | ORAL_TABLET | Freq: Four times a day (QID) | ORAL | Status: DC | PRN
Start: 2018-04-24 — End: 2018-04-25

## 2018-04-24 MED ORDER — ACETAMINOPHEN 325 MG PO TABS: 650.0000 mg | ORAL_TABLET | Freq: Four times a day (QID) | ORAL | Status: DC | PRN

## 2018-04-24 MED ORDER — ACETAMINOPHEN 325 MG PO TABS
650.0000 mg | ORAL_TABLET | Freq: Four times a day (QID) | ORAL | Status: DC
  Administered 2018-04-24 – 2018-04-25 (×5): 650 mg via ORAL
  Filled 2018-04-24 (×5): qty 2

## 2018-04-24 MED ORDER — POLYETHYLENE GLYCOL 3350 17 G PO PACK
17.0000 g | PACK | Freq: Every day | ORAL | Status: DC
  Administered 2018-04-24 – 2018-04-25 (×2): 17 g via ORAL
  Filled 2018-04-24 (×2): qty 1

## 2018-04-24 MED ORDER — ACETAMINOPHEN 650 MG RE SUPP: 650.0000 mg | Freq: Four times a day (QID) | RECTAL | Status: DC | PRN

## 2018-04-24 MED ORDER — SENNOSIDES-DOCUSATE SODIUM 8.6-50 MG PO TABS
1.0000 | ORAL_TABLET | Freq: Every day | ORAL | Status: DC
  Administered 2018-04-24: 1 via ORAL
  Filled 2018-04-24: qty 1

## 2018-04-24 NOTE — Progress Notes (Signed)
PT Cancellation Note  Patient Details Name: Keith SlickerJoe L Labree MRN: [id number] DOB: [birth year]   Cancelled Treatment:    Reason Eval/Treat Not Completed: Other (comment).  Pt is asleep and family asked PT not to bother him.  Will try again in the am.   Ivar DrapeRuth E Adler [place] 04/24/2018, 4:05 PM   Samul Dadauth Naleigha Raimondi, PT MS Acute Rehab Dept. Number: [facility] [phone] and [facility] [phone]

## 2018-04-24 NOTE — Consult Note (Signed)
Consultation Note Date: 04/24/2018   Patient Name: Keith [name]  DOB: 30-Jan-1935  MRN: [id number]  Age / Sex: 83 y.o., male  PCP: Patient, No Pcp Per Referring Physician: Gladstone Lighter, MD  Reason for Consultation: Establishing goals of care  HPI/Patient Profile: 83 y.o. male  with past medical history of dementia, a fib admitted on 04/22/2018 with fall resulting in subdural hemorrhage. No surgical intervention recommended due to patient's advanced dementia, oral anticoagulation was reversed with Wandalee Ferdinand. Second CT scan shows bleeding now stable. Palliative medicine consulted for [place].    Clinical Assessment and Goals of Care:  I have reviewed medical records including EPIC notes, labs and imaging, assessed the patient and then met at the bedside along with patient's daughterGuerry Minors,  to discuss diagnosis prognosis, GOC, EOL wishes, disposition and options.  I introduced Palliative Medicine as specialized medical care for people living with serious illness. It focuses on providing relief from the symptoms and stress of a serious illness. The goal is to improve quality of life for both the patient and the family.  We discussed a brief life review of the patient. He previously worked in a hospital, taking care of behavioral health patients. 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   As far as functional and nutritional status- [name] has noted significant and sudden changes in the last few months. He is no longer able to feed himself. I observed her at the bedside feeding him. She tried to put a cup in his hand and he did not know what to do with it. He has had several falls, losing the ability to walk, noted to have global weakness. There has been significant weight loss.  He cannot have a conversation. His speech is unintelligible. He will sometimes repeat a word.   We discussed their current illness and what  it means in the larger context of their on-going co-morbidities.  Natural disease trajectory and expectations at EOL were discussed. We discussed the trajectory of dementia and its effect on the ability to walk, eat, communicate, and effect on quality of life.   I attempted to elicit values and goals of care important to the patient. It is important for him to be well cared for, and not in pain.  She does note that he is constipated. States he has not had a BM since Monday- she believes this affects his comfort and asks for it to be addressed. She would not want extreme life prolonging measures. There is also concern that he has pain from his fall that he can't communicate. He has lacerations over his left eye and bruising on his left shoulder and side.   The difference between aggressive medical intervention and comfort care was considered in light of the patient's goals of care.   Advanced directives, concepts specific to code status, artifical feeding and hydration, and rehospitalization were considered and discussed. Loretta's goals are for her Dad to hopefully go to [facility]. She would like him to be kept comfortable. She would consider rehospitalization  for pneumonia or infection. Patient is DNR.   Hospice and Palliative Care services outpatient were explained and offered. Loretta requests Hospice services at discharge.   Questions and concerns were addressed.  Hard Choices booklet left for review. The family was encouraged to call with questions or concerns.   Primary Decision Maker NEXT OF KIN- patient's daughter- [name]    SUMMARY OF RECOMMENDATIONS - PT consult for safe discharge venue -Referral for Hospice services at discharge -[name] has many concerns in regards to paying for SNF vs ALF- she is hoping for patient to reside at [facility]- there are concerns if he is eligible. Patient may have a long term care insurance policy- she is looking into this -Miralax 17gm x1 for  constipation -Acetaminophen 691m po q6hrs  -Tramadol 571mq6hrs prn for agitation which may indicate pain in patient who cannot ask for pain medication     Code Status/Advance Care Planning:  DNR  Palliative Prophylaxis:   Frequent Pain Assessment  Additional Recommendations (Limitations, Scope, Preferences):  No Artificial Feeding and No Surgical Procedures  Prognosis:    < 6 months  Discharge Planning: Home with Hospice  Primary Diagnoses: Present on Admission: **None**   I have reviewed the medical record, interviewed the patient and family, and examined the patient. The following aspects are pertinent.  History reviewed. No pertinent past medical history. Social History   Socioeconomic History  . Marital status: Divorced    Spouse name: Not on file  . Number of children: Not on file  . Years of education: Not on file  . Highest education level: Not on file  Occupational History  . Not on file  Social Needs  . Financial resource strain: Not on file  . Food insecurity:    Worry: Not on file    Inability: Not on file  . Transportation needs:    Medical: Not on file    Non-medical: Not on file  Tobacco Use  . Smoking status: Unknown If Ever Smoked  . Smokeless tobacco: Never Used  Substance and Sexual Activity  . Alcohol use: Not Currently  . Drug use: Not Currently  . Sexual activity: Not Currently  Lifestyle  . Physical activity:    Days per week: Not on file    Minutes per session: Not on file  . Stress: Not on file  Relationships  . Social connections:    Talks on phone: Not on file    Gets together: Not on file    Attends religious service: Not on file    Active member of club or organization: Not on file    Attends meetings of clubs or organizations: Not on file    Relationship status: Not on file  Other Topics Concern  . Not on file  Social History Narrative  . Not on file   History reviewed. No pertinent family history. Scheduled  Meds: . divalproex  250 mg Oral BID  . donepezil  10 mg Oral QHS  . memantine  28 mg Oral Daily  . mirtazapine  7.5 mg Oral QHS  . tamsulosin  0.4 mg Oral QPC supper   Continuous Infusions: PRN Meds:.acetaminophen **OR** acetaminophen, albuterol, bisacodyl, LORazepam, morphine injection, ondansetron **OR** ondansetron (ZOFRAN) IV, senna-docusate Medications Prior to Admission:  Prior to Admission medications   Medication Sig Start Date End Date Taking? Authorizing Provider  amLODipine (NORVASC) 10 MG tablet Take 10 mg by mouth daily.   Yes [provider]  ammonium lactate (LAC-HYDRIN) 12 % lotion Apply  1 application topically daily as needed for dry skin (LEG WRAPPING).   Yes [provider]  atorvastatin (LIPITOR) 20 MG tablet Take 20 mg by mouth daily.   Yes [provider]  divalproex (DEPAKOTE SPRINKLE) 125 MG capsule Take 250 mg by mouth 2 (two) times daily.   Yes [provider]  donepezil (ARICEPT) 10 MG tablet Take 10 mg by mouth at bedtime.   Yes [provider]  hydrALAZINE (APRESOLINE) 10 MG tablet Take 10 mg by mouth 3 (three) times daily.   Yes [provider]  lanolin-mineral oil (BABY OIL) OIL Apply topically 2 (two) times daily.   Yes [provider]  LORazepam (ATIVAN) 0.5 MG tablet Take 0.5 mg by mouth every 6 (six) hours as needed for anxiety (AGITATION CONTROL).   Yes [provider]  memantine (NAMENDA XR) 28 MG CP24 24 hr capsule Take 28 mg by mouth daily.   Yes [provider]  mirtazapine (REMERON) 7.5 MG tablet Take 7.5 mg by mouth at bedtime.   Yes [provider]  Multiple Vitamin (MULTIVITAMIN) tablet Take 1 tablet by mouth daily.   Yes [provider]  polyethylene glycol (MIRALAX / GLYCOLAX) packet Take 17 g by mouth daily.   Yes [provider]  rivaroxaban (XARELTO) 20 MG TABS tablet Take 20 mg by mouth daily with supper.   Yes [provider]    tamsulosin (FLOMAX) 0.4 MG CAPS capsule Take 0.4 mg by mouth daily after supper.   Yes [provider]   No Known Allergies Review of Systems  Unable to perform ROS: Dementia    Physical Exam  Constitutional: He appears well-developed.  Thin, appears frail  Cardiovascular: Normal rate and regular rhythm.  Pulmonary/Chest: Effort normal.  Cough after eating  Abdominal: Soft. Bowel sounds are normal. He exhibits no distension. There is no tenderness.  Musculoskeletal:  Generalized weakness   Neurological:  Awake, unable to assess orientation, unintelligible speech  Nursing note and vitals reviewed.   Vital Signs: BP 109/65 (BP Location: Left Arm)   Pulse 71   Temp 99.9 F (37.7 C) (Oral)   Resp 17   Ht _0  (1.88 m)   Wt 91.1 kg (200 lb 12.8 oz)   SpO2 96%   BMI 25.78 kg/m  Pain Scale: PAINAD       SpO2: SpO2: 96 % O2 Device:SpO2: 96 % O2 Flow Rate: .   IO: Intake/output summary:   Intake/Output Summary (Last 24 hours) at 04/24/2018 1419 Last data filed at 04/24/2018 1031 Gross per 24 hour  Intake -  Output 900 ml  Net -900 ml    LBM: Last BM Date: (last bowel movement unknown) Baseline Weight: Weight: 94 kg (207 lb 4 oz) Most recent weight: Weight: 91.1 kg (200 lb 12.8 oz)     Palliative Assessment/Data: PPS: 30%     Thank you for this consult. Palliative medicine will continue to follow and assist as needed.   Time In: 1315 Time Out: 1445 Time Total: 90 minutes Greater than 50%  of this time was spent counseling and coordinating care related to the above assessment and plan.  Signed by: Mariana Kaufman, AGNP-C Palliative Medicine    Please contact Palliative Medicine Team phone at [phone] for questions and concerns.  For individual provider: See Shea Evans

## 2018-04-24 NOTE — Progress Notes (Signed)
[facility] [place] [facility]   PATIENT NAME: Keith [name]    MR#:  [id number]  DATE OF BIRTH:  December 29, 1934  SUBJECTIVE:  CHIEF COMPLAINT:   Chief Complaint  Patient presents with  . Fall   -Daughter at bedside, patient is pleasantly confused, however he was able to finish his breakfast this morning.  REVIEW OF SYSTEMS:  Review of Systems  Unable to perform ROS: Dementia    DRUG ALLERGIES:  No Known Allergies  VITALS:  Blood pressure 109/65, pulse 71, temperature 99.9 F (37.7 C), temperature source Oral, resp. rate 17, height 6\' 2"  (1.88 m), weight 91.1 kg (200 lb 12.8 oz), SpO2 96 %.  PHYSICAL EXAMINATION:  Physical Exam  GENERAL:  83 y.o.-year-old patient lying in the bed with no acute distress.  EYES: Pupils equal, round, reactive to light and accommodation. No scleral icterus. Extraocular muscles intact.  HEENT: Head atraumatic, normocephalic. Oropharynx and nasopharynx clear.  NECK:  Supple, no jugular venous distention. No thyroid enlargement, no tenderness.  LUNGS: Normal breath sounds bilaterally, no wheezing, rales,rhonchi or crepitation. No use of accessory muscles of respiration. Decreased bibasilar breath sounds CARDIOVASCULAR: S1, S2 normal. No  rubs, or gallops. 3/6 systolic murmur present. ABDOMEN: Soft, nontender, nondistended. Bowel sounds present. No organomegaly or mass.  EXTREMITIES: No pedal edema, cyanosis, or clubbing.  NEUROLOGIC: Cranial nerves II through XII are intact. Muscle strength 5/5 in all extremities. Global weakness noted. Sensation intact. Gait not checked.  PSYCHIATRIC: The patient is alert but disoriented, unable to say his name- but speech is clear to answer some questions.  SKIN: No obvious rash, lesion, or ulcer.    LABORATORY PANEL:   CBC Recent Labs  Lab 04/24/18 0424  WBC 5.1  HGB 10.4*  HCT 30.7*  PLT 112*    ------------------------------------------------------------------------------------------------------------------  Chemistries  Recent Labs  Lab 04/24/18 0424  NA 140  K 4.6  CL 107  CO2 27  GLUCOSE 99  BUN 23  CREATININE 1.77*  CALCIUM 8.3*   ------------------------------------------------------------------------------------------------------------------  Cardiac Enzymes No results for input(s): TROPONINI in the last 168 hours. ------------------------------------------------------------------------------------------------------------------  RADIOLOGY:  Ct Head Wo Contrast  Result Date: 04/22/2018 CLINICAL DATA:  Intracranial hemorrhage follow up EXAM: CT HEAD WITHOUT CONTRAST TECHNIQUE: Contiguous axial images were obtained from the base of the skull through the vertex without intravenous contrast. COMPARISON:  04/22/2018 at 14:38 FINDINGS: Brain: Bilateral mixed density subdural collections are unchanged in size, measuring 14 mm on the right and 8 mm on the left. There is no midline shift. Blood is again seen layering along the falx cerebri and within the occipital horns of both lateral ventricles. There is confluent white matter hypoattenuation. No new parenchymal abnormality. Vascular: There is carotid atherosclerotic calcification at the skull base. No hyperdense vessel. Skull: Normal Sinuses/Orbits: Visualized paranasal sinuses are unremarkable. No mastoid effusion. Normal orbits. 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 Unchanged volume of intraventricular blood. Electronically Signed   By: Deatra RobinsonKevin  Herman M.D.   On: 04/22/2018 22:26   Ct Head Wo Contrast  Result Date: 04/22/2018 CLINICAL DATA:  Status post fall at 1 p.m. today. Laceration above the left eye. EXAM: CT HEAD WITHOUT CONTRAST CT CERVICAL SPINE WITHOUT CONTRAST TECHNIQUE: Multidetector CT imaging of the head and cervical spine was performed following  the standard protocol without intravenous contrast. Multiplanar CT image reconstructions of the cervical spine were also generated. COMPARISON:  None. FINDINGS: CT HEAD FINDINGS Brain: The  patient has bilateral extra-axial fluid collections. Both collections have mixed attenuation within them consistent with acute or subacute on chronic subdural hematomas. The collection on the right is larger measuring up to 1.7 cm in thickness compared to the collection on the left which is up to 0.9 cm in thickness. No left to right midline shift is identified. Acute subdural hemorrhage is seen over the tentorium bilaterally, greater in volume on the left. A small amount of hemorrhage is also seen layering in the posterior horns of the lateral ventricles. The brain is atrophic with chronic microvascular ischemic change. No hydrocephalus or pneumocephalus. Vascular: Atherosclerosis is noted. Skull: Intact.  No focal lesion. Sinuses/Orbits: Mild mucosal thickening right ethmoid air cells is identified. Other: None. CT CERVICAL SPINE FINDINGS Alignment: Maintained with straightening of lordosis noted. Skull base and vertebrae: No acute fracture. No primary bone lesion or focal pathologic process. Soft tissues and spinal canal: No prevertebral fluid or swelling. No visible canal hematoma. Disc levels: Intervertebral disc space height is maintained. Multilevel facet arthropathy is noted. Upper chest: Lung apices clear. Other: None. 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 Acute subdural blood is also seen layering over the tentorium bilaterally, greater on the right, and there is a small volume of hemorrhage within the ventricular system. Negative for hydrocephalus or midline shift. No acute abnormality cervical spine. Atrophy and chronic microvascular ischemic change. Atherosclerosis. Multilevel facet arthropathy cervical spine. Critical Value/emergent results were called by telephone at the  time of interpretation on 04/22/2018 at 3:03 pm to Dr. Willy Eddy , who verbally acknowledged these results. Electronically Signed   By: Drusilla Kanner M.D.   On: 04/22/2018 15:06   Ct Cervical Spine Wo Contrast  Result Date: 04/22/2018 CLINICAL DATA:  Status post fall at 1 p.m. today. Laceration above the left eye. EXAM: CT HEAD WITHOUT CONTRAST CT CERVICAL SPINE WITHOUT CONTRAST TECHNIQUE: Multidetector CT imaging of the head and cervical spine was performed following the standard protocol without intravenous contrast. Multiplanar CT image reconstructions of the cervical spine were also generated. COMPARISON:  None. FINDINGS: CT HEAD FINDINGS Brain: The patient has bilateral extra-axial fluid collections. Both collections have mixed attenuation within them consistent with acute or subacute on chronic subdural hematomas. The collection on the right is larger measuring up to 1.7 cm in thickness compared to the collection on the left which is up to 0.9 cm in thickness. No left to right midline shift is identified. Acute subdural hemorrhage is seen over the tentorium bilaterally, greater in volume on the left. A small amount of hemorrhage is also seen layering in the posterior horns of the lateral ventricles. The brain is atrophic with chronic microvascular ischemic change. No hydrocephalus or pneumocephalus. Vascular: Atherosclerosis is noted. Skull: Intact.  No focal lesion. Sinuses/Orbits: Mild mucosal thickening right ethmoid air cells is identified. Other: None. CT CERVICAL SPINE FINDINGS Alignment: Maintained with straightening of lordosis noted. Skull base and vertebrae: No acute fracture. No primary bone lesion or focal pathologic process. Soft tissues and spinal canal: No prevertebral fluid or swelling. No visible canal hematoma. Disc levels: Intervertebral disc space height is maintained. Multilevel facet arthropathy is noted. Upper chest: Lung apices clear. Other: None. IMPRESSION: Bilateral  acute on chronic subdural hematomas, larger on the right. Acute subdural blood is also seen layering over the tentorium bilaterally, greater on the right, and there is a small volume of hemorrhage within the ventricular system. Negative for hydrocephalus or midline shift. No acute abnormality cervical spine.  Atrophy and chronic microvascular ischemic change. Atherosclerosis. Multilevel facet arthropathy cervical spine. Critical Value/emergent results were called by telephone at the time of interpretation on 04/22/2018 at 3:03 pm to Dr. Willy Eddy , who verbally acknowledged these results. Electronically Signed   By: Drusilla Kanner M.D.   On: 04/22/2018 15:06    EKG:  No orders found for this or any previous visit.  ASSESSMENT AND PLAN:   83 year old male with past medical history significant for advanced dementia, CAD, atrial fibrillation on Xarelto was brought in from assisted living facility secondary to a fall and subdural hematoma.  1.  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  Able to be fed -Appreciate neurosurgery consult.  No acute intervention recommended -Hold off anticoagulation at this time.  Received Kcentra for reversal on admission -Palliative care meeting with the family today. -Likely will need to go to assisted living with hospice services  2.  Dementia with mood changes-continue Namenda and Aricept -On Depakote for mood stabilization -Also on Remeron for sleep and depression  3.  BPH-on Flomax  4.  DVT prophylaxis-on teds and SCDs.  Palliative care meeting with family today   All the records are reviewed and case discussed with Care Management/Social Workerr. Management plans discussed with the patient, family and they are in agreement.  CODE STATUS: DNR  TOTAL TIME TAKING CARE OF THIS PATIENT: 38 minutes.   POSSIBLE D/C IN 1-2 DAYS,  DEPENDING ON CLINICAL CONDITION.   Deyanira Fesler M.D on 04/24/2018 at 2:00 PM  Between 7am to 6pm - Pager - [phone]  After 6pm go to [email or web address] - password [email or web address]  [facility]  Office  [phone]  CC: Primary care physician; Patient, No Pcp Per

## 2018-04-25 MED ORDER — BISACODYL 10 MG RE SUPP
10.0000 mg | Freq: Every day | RECTAL | Status: DC | PRN
  Filled 2018-04-25: qty 1

## 2018-04-25 MED ORDER — LORAZEPAM 0.5 MG PO TABS: 0.5000 mg | ORAL_TABLET | Freq: Four times a day (QID) | ORAL | 0 refills | Status: DC | PRN

## 2018-04-25 MED ORDER — TRAMADOL HCL 50 MG PO TABS: 50.0000 mg | ORAL_TABLET | Freq: Four times a day (QID) | ORAL | 0 refills | Status: DC | PRN

## 2018-04-25 MED ORDER — HYDROCERIN EX CREA
TOPICAL_CREAM | Freq: Every day | CUTANEOUS | Status: DC
  Filled 2018-04-25: qty 113

## 2018-04-25 NOTE — [place] FL2 (Signed)
[place] MEDICAID FL2 LEVEL OF CARE SCREENING TOOL     IDENTIFICATION  Patient Name: Keith SlickerJoe L Bloxom Birthdate: 1934-11-05 Sex: male Admission Date (Current Location): 04/22/2018  [place]   Facility and Address:  [facility], [address], [place], [place] [address]      Provider Number: [id number]  Attending Physician Name and Address:  Enid BaasKalisetti, Radhika, MD  Relative Name and Phone Number:  [name]- daughter    [phone]     Current Level of Care: Hospital Recommended Level of Care: Skilled Nursing Facility Prior Approval Number:    Date Approved/Denied:   PASRR Number:    Discharge Plan: SNF    Current Diagnoses: Patient Active Problem List   Diagnosis Date Noted  . Dementia with behavioral disturbance   . Slow transit constipation   . Palliative care by specialist   . Advance care planning   . Goals of care, counseling/discussion   . Intracranial bleeding (HCC) 04/22/2018    Orientation RESPIRATION BLADDER Height & Weight     Self  Normal Incontinent Weight: 200 lb 12.8 oz (91.1 kg) Height:  6\' 2"  (188 cm)  BEHAVIORAL SYMPTOMS/MOOD NEUROLOGICAL BOWEL NUTRITION STATUS  (none) (none) Incontinent Diet(Regular)  AMBULATORY STATUS COMMUNICATION OF NEEDS Skin   Extensive Assist Verbally Normal                       Personal Care Assistance Level of Assistance  Bathing, Feeding, Dressing Bathing Assistance: Limited assistance Feeding assistance: Independent Dressing Assistance: Limited assistance     Functional Limitations Info  Sight, Hearing, Speech Sight Info: Adequate Hearing Info: Adequate Speech Info: Adequate    SPECIAL CARE FACTORS FREQUENCY  PT (By licensed PT), OT (By licensed OT)     PT Frequency: 5 OT Frequency: 5            Contractures Contractures Info: Not present    Additional Factors Info  Code Status, Allergies Code Status Info: DNR Allergies Info:  NKA           Current Medications (04/25/2018):  This is the current hospital active medication list Current Facility-Administered Medications  Medication Dose Route Frequency Provider Last Rate Last Dose  . acetaminophen (TYLENOL) tablet 650 mg  650 mg Oral Q6H Barbara CowerMahan, Kasie J, NP   650 mg at 04/25/18 0502  . albuterol (PROVENTIL) (2.5 MG/3ML) 0.083% nebulizer solution 2.5 mg  2.5 mg Nebulization Q2H PRN Shaune Pollackhen, Qing, MD      . bisacodyl (DULCOLAX) suppository 10 mg  10 mg Rectal Daily PRN Enid BaasKalisetti, Radhika, MD      . divalproex (DEPAKOTE SPRINKLE) capsule 250 mg  250 mg Oral BID Oralia ManisWillis, David, MD   250 mg at 04/25/18 0929  . donepezil (ARICEPT) tablet 10 mg  10 mg Oral Lamont SnowballQHS Willis, David, MD   10 mg at 04/24/18 2037  . LORazepam (ATIVAN) injection 1 mg  1 mg Intravenous Q1H PRN Shaune Pollackhen, Qing, MD      . memantine (NAMENDA XR) 24 hr capsule 28 mg  28 mg Oral Daily Oralia ManisWillis, David, MD   28 mg at 04/25/18 0929  . mirtazapine (REMERON) tablet 7.5 mg  7.5 mg Oral Lamont SnowballQHS Willis, David, MD   7.5 mg at 04/24/18 2037  . ondansetron (ZOFRAN) tablet 4 mg  4 mg Oral Q6H PRN Shaune Pollackhen, Qing, MD       Or  . ondansetron [facility](ZOFRAN) injection 4 mg  4 mg Intravenous Q6H PRN  Shaune Pollack, MD      . polyethylene glycol [facility] Ethelene Hal) packet 17 g  17 g Oral Daily Barbara Cower, NP   17 g at 04/25/18 0929  . senna-docusate (Senokot-S) tablet 1 tablet  1 tablet Oral QHS Barbara Cower, NP   1 tablet at 04/24/18 2037  . tamsulosin (FLOMAX) capsule 0.4 mg  0.4 mg Oral QPC supper Oralia Manis, MD   0.4 mg at 04/24/18 1645  . traMADol (ULTRAM) tablet 50 mg  50 mg Oral Q6H PRN Barbara Cower, NP         Discharge Medications: Please see discharge summary for a list of discharge medications.  Relevant Imaging Results:  Relevant Lab Results:   Additional Information    Loney Peto  Rinaldo Ratel, [address]

## 2018-04-25 NOTE — Evaluation (Signed)
Physical Therapy Evaluation Patient Details Name: Keith SlickerJoe L Dubow MRN: [id number] DOB: 1934/11/16 Today's Date: 04/25/2018   History of Present Illness  Pt presents to hospital after an unwitnessed fall on 04/22/18 and diagnosed with acute on chronic B subdural hematomas that repeat imaging shows are stable, and dementia with behavioral disturbances. Pt has a past medical history that includes a-fib, CAD, and dementia    Clinical Impression  Pt is a pleasant 83 year old male who was admitted for an acute on chronic B subdural hematoma, and dementia with behavioral disturbances after an unwitnessed fall, pt currently has B distal LE weeping wounds with a wound care consult pending. Pt unable to perform bed mobility or transfers at this time limited due to his cognitive impairments, when PT attempts max assist pt resist and pulls the opposite direction. Pt is non verbal mumbling throughout session. Pts daughter present and emotional throughout session and states that pt is currently at his cognitive baseline. Pt demonstrates deficits with mobility limited mostly by his cognition. Pt is currently off of his baseline however due to his cognitive impairments is likely unable to participate meaningfully in therapy at d/c. PT plans to pick up pt on case load for trial of 3 sessions while admitted to see if pt is able to participate in therapy. Pt could benefit from continued skilled therapy at this time to improve deficits toward PLOF. D/c recommendations at this time are for long term care.     Follow Up Recommendations Other (comment)(long term care)    Equipment Recommendations  None recommended by PT    Recommendations for Other Services       Precautions / Restrictions Precautions Precautions: None Restrictions Weight Bearing Restrictions: No      Mobility  Bed Mobility Overal bed mobility: Needs Assistance             General bed mobility comments: Unable to assess due to pts impaired  cognition, when attempting max assist with bed mobility pt resist making it unsafe to perform  Transfers Overall transfer level: Needs assistance               General transfer comment: Unable to assess due to pts impaired cognition, when attempting max assist with bed mobility pt resist making it unsafe to perform  Ambulation/Gait             General Gait Details: Unable to assess due to pts impaired cognition, when attempting max assist with bed mobility pt resist making it unsafe to perform  Stairs            Wheelchair Mobility    Modified Rankin (Stroke Patients Only)       Balance                                             Pertinent Vitals/Pain Pain Assessment: No/denies pain(Unable to verbalize however does not appear in distress)    Home Living Family/patient expects to be discharged to:: Assisted living               Home Equipment: None Additional Comments[facility], pts daughter states that he will not be returning there because she does not believe it is safe. She states that she will not be able to take him home    Prior Function Level of Independence: Needs assistance  Gait / Transfers Assistance Needed: At baseline pt amb without AD within ALF however with slow gait. Pt able to feed self. One fall prior to this admission, one incident of possible fall according to pts daughter  ADL's / Homemaking Assistance Needed: Dependent on staff at ALF        Hand Dominance        Extremity/Trunk Assessment   Upper Extremity Assessment Upper Extremity Assessment: Difficult to assess due to impaired cognition(Unable to assess due to inabilty to follow commands)    Lower Extremity Assessment Lower Extremity Assessment: Difficult to assess due to impaired cognition(Unable to assess due to inabilty to follow commands)       Communication   Communication: No difficulties  Cognition  Arousal/Alertness: Awake/alert Behavior During Therapy: WFL for tasks assessed/performed Overall Cognitive Status: History of cognitive impairments - at baseline                                 General Comments: Pt has "severe" dementia, is unable to verbalize at this time, unable to follow simple commands at this time      General Comments General comments (skin integrity, edema, etc.): pt has weeping wounds on distal B LEs R>L    Exercises     Assessment/Plan    PT Assessment Patient needs continued PT services  PT Problem List Decreased mobility       PT Treatment Interventions Gait training;Stair training;Functional mobility training;Therapeutic activities;Therapeutic exercise;Balance training    PT Goals (Current goals can be found in the Care Plan section)  Acute Rehab PT Goals Patient Stated Goal: unable to state PT Goal Formulation: With patient/family Time For Goal Achievement: 05/09/18 Potential to Achieve Goals: Poor    Frequency Min 2X/week   Barriers to discharge Other (comment) family unable to provide amount of assistance needed    Co-evaluation               AM-PAC PT "6 Clicks" Daily Activity  Outcome Measure Difficulty turning over in bed (including adjusting bedclothes, sheets and blankets)?: Unable Difficulty moving from lying on back to sitting on the side of the bed? : Unable Difficulty sitting down on and standing up from a chair with arms (e.g., wheelchair, bedside commode, etc,.)?: Unable Help needed moving to and from a bed to chair (including a wheelchair)?: Total Help needed walking in hospital room?: Total Help needed climbing 3-5 steps with a railing? : Total 6 Click Score: 6    End of Session   Activity Tolerance: (Limited due to pts cognition) Patient left: in bed;with call bell/phone within reach;with bed alarm set;with family/visitor present;with nursing/sitter in room Nurse Communication: Mobility status;Other  (comment)(d/c recommendation) PT Visit Diagnosis: Other (comment)(impaired cognition impeding mobility)    Time: 1030-1050 PT Time Calculation (min) (ACUTE ONLY): 20 min   Charges:   PT Evaluation $PT Eval Moderate Complexity: 1 Mod          Chivas Notz, SPT   Lynnex Fulp 04/25/2018, 1:52 PM

## 2018-04-25 NOTE — Discharge Summary (Addendum)
[facility] - [place] at [facility]   PATIENT NAME: Keith [name]    MR#:  [id number]  DATE OF BIRTH:  [birth year]  DATE OF ADMISSION:  04/22/2018   ADMITTING PHYSICIAN: Shaune Pollack, MD  DATE OF DISCHARGE:  04/25/18  PRIMARY CARE PHYSICIAN: Patient, No Pcp Per   ADMISSION DIAGNOSIS:   Subdural hematoma (HCC) [S06.5X9A] Injury of head, initial encounter [S09.90XA]  DISCHARGE DIAGNOSIS:   Active Problems:   Intracranial bleeding (HCC)   Dementia with behavioral disturbance   Slow transit constipation   Palliative care by specialist   Advance care planning   Goals of care, counseling/discussion   SECONDARY DIAGNOSIS:   History reviewed. No pertinent past medical history.  HOSPITAL COURSE:   83 year old male with past medical history significant for advanced dementia, CAD, atrial fibrillation on Xarelto was brought in from assisted living facility secondary to a fall and subdural hematoma.  1.  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  Able to be fed -Appreciate neurosurgery consult.  No acute intervention recommended -Hold off anticoagulation at this time.  Received Kcentra for reversal on admission -Palliative care meeting appreciated. PT consult recommended.  Concern about participating  with therapy due to his cognitive decline. -Likely will need to go to assisted living with hospice services  2.  Dementia with mood changes-continue Namenda and Aricept -On Depakote for mood stabilization -Also on Remeron for sleep and depression  3.  BPH-on Flomax  Going to [facility] with palliative care    DISCHARGE CONDITIONS:   Critical  CONSULTS OBTAINED:   Treatment Team:  Venetia Night, MD  DRUG ALLERGIES:   No Known Allergies DISCHARGE MEDICATIONS:   Allergies as of 04/25/2018   No Known Allergies     Medication List    STOP  taking these medications   amLODipine 10 MG tablet Commonly known as:  NORVASC   atorvastatin 20 MG tablet Commonly known as:  LIPITOR   hydrALAZINE 10 MG tablet Commonly known as:  APRESOLINE   lanolin-mineral oil Oil   rivaroxaban 20 MG Tabs tablet Commonly known as:  XARELTO     TAKE these medications   ammonium lactate 12 % lotion Commonly known as:  LAC-HYDRIN Apply 1 application topically daily as needed for dry skin (LEG WRAPPING).   divalproex 125 MG capsule Commonly known as:  DEPAKOTE SPRINKLE Take 250 mg by mouth 2 (two) times daily.   donepezil 10 MG tablet Commonly known as:  ARICEPT Take 10 mg by mouth at bedtime.   LORazepam 0.5 MG tablet Commonly known as:  ATIVAN Take 1 tablet (0.5 mg total) by mouth every 6 (six) hours as needed for anxiety (AGITATION CONTROL).   mirtazapine 7.5 MG tablet Commonly known as:  REMERON Take 7.5 mg by mouth at bedtime.   multivitamin tablet Take 1 tablet by mouth daily.   NAMENDA XR 28 MG Cp24 24 hr capsule Generic drug:  memantine Take 28 mg by mouth daily.   polyethylene glycol packet Commonly known as:  MIRALAX / GLYCOLAX Take 17 g by mouth daily.   tamsulosin 0.4 MG Caps capsule Commonly known as:  FLOMAX Take 0.4 mg by mouth daily after supper.   traMADol 50 MG tablet Commonly known as:  ULTRAM Take 1 tablet (50 mg total) by mouth every 6 (six) hours as needed for moderate pain (GIVE IF PATIENT SHOWS SIGNS OF AGITATION- THIS MAY INDICATE PAIN THAT  HE CANNOT COMMUNICATE).        DISCHARGE INSTRUCTIONS:   1. PCP f/u in 1 week 2. Palliative care follow up  DIET:   Regular diet  ACTIVITY:   Activity as tolerated  OXYGEN:   Home Oxygen: No.  Oxygen Delivery: room air  DISCHARGE LOCATION:   Assisted Living   If you experience worsening of your admission symptoms, develop shortness of breath, life threatening emergency, suicidal or homicidal thoughts you must seek medical attention  immediately by calling 911 or calling your MD immediately  if symptoms less severe.  You Must read complete instructions/literature along with all the possible adverse reactions/side effects for all the Medicines you take and that have been prescribed to you. Take any new Medicines after you have completely understood and accpet all the possible adverse reactions/side effects.   Please note  You were cared for by a hospitalist during your hospital stay. If you have any questions about your discharge medications or the care you received while you were in the hospital after you are discharged, you can call the unit and asked to speak with the hospitalist on call if the hospitalist that took care of you is not available. Once you are discharged, your primary care physician will handle any further medical issues. Please note that NO REFILLS for any discharge medications will be authorized once you are discharged, as it is imperative that you return to your primary care physician (or establish a relationship with a primary care physician if you do not have one) for your aftercare needs so that they can reassess your need for medications and monitor your lab values.    On the day of Discharge:  VITAL SIGNS:   Blood pressure 140/80, pulse 74, temperature 98.5 F (36.9 C), temperature source Oral, resp. rate 18, height 6\' 2"  (1.88 m), weight 91.1 kg (200 lb 12.8 oz), SpO2 100 %.  PHYSICAL EXAMINATION:   GENERAL:  83 y.o.-year-old patient lying in the bed with no acute distress.  EYES: Pupils equal, round, reactive to light and accommodation. No scleral icterus. Extraocular muscles intact.  HEENT: Head atraumatic, normocephalic. Oropharynx and nasopharynx clear.  NECK:  Supple, no jugular venous distention. No thyroid enlargement, no tenderness.  LUNGS: Normal breath sounds bilaterally, no wheezing, rales,rhonchi or crepitation. No use of accessory muscles of respiration. Decreased bibasilar breath  sounds CARDIOVASCULAR: S1, S2 normal. No  rubs, or gallops. 3/6 systolic murmur present. ABDOMEN: Soft, nontender, nondistended. Bowel sounds present. No organomegaly or mass.  EXTREMITIES: No pedal edema, cyanosis, or clubbing.  NEUROLOGIC: Cranial nerves II through XII are intact. Left sided hemi neglect seen. Muscle strength 5/5 in all extremities. Global weakness noted. Sensation intact. Gait not checked.  PSYCHIATRIC: The patient is alert but disoriented.  SKIN: No obvious rash, lesion, or ulcer.    DATA REVIEW:   CBC Recent Labs  Lab 04/24/18 0424  WBC 5.1  HGB 10.4*  HCT 30.7*  PLT 112*    Chemistries  Recent Labs  Lab 04/24/18 0424  NA 140  K 4.6  CL 107  CO2 27  GLUCOSE 99  BUN 23  CREATININE 1.77*  CALCIUM 8.3*     Microbiology Results  Results for orders placed or performed during the hospital encounter of 04/22/18  MRSA PCR Screening     Status: None   Collection Time: 04/23/18  4:46 AM  Result Value Ref Range Status   MRSA by PCR NEGATIVE NEGATIVE Final    Comment:  The GeneXpert MRSA Assay (FDA approved for NASAL specimens only), is one component of a comprehensive MRSA colonization surveillance program. It is not intended to diagnose MRSA infection nor to guide or monitor treatment for MRSA infections. Performed at [facility], [address] [place], [place] [address]     RADIOLOGY:  No results found.   Management plans discussed with the patient, family and they are in agreement.  CODE STATUS:     Code Status Orders  (From admission, onward)        Start     Ordered   04/22/18 2113  Do not attempt resuscitation (DNR)  Continuous    Question Answer Comment  In the event of cardiac or respiratory ARREST Do not call a "code blue"   In the event of cardiac or respiratory ARREST Do not perform Intubation, CPR, defibrillation or ACLS   In the event of cardiac or respiratory ARREST Use medication by any route,  position, wound care, and other measures to relive pain and suffering. May use oxygen, suction and manual treatment of airway obstruction as needed for comfort.      04/22/18 2112    Code Status History    This patient has a current code status but no historical code status.    Advance Directive Documentation     Most Recent Value  Type of Advance Directive  Living will, Healthcare Power of Attorney  Pre-existing out of facility DNR order (yellow form or pink MOST form)  -  "MOST" Form in Place?  -      TOTAL TIME TAKING CARE OF THIS PATIENT: 38 minutes.    Enid Baas M.D on 04/25/2018 at 2:21 PM  Between 7am to 6pm - Pager - [phone]  After 6pm go to [email or web address] - Social research officer, government  [facility] [place] [facility]  Office  [phone]  CC: Primary care physician; Patient, No Pcp Per   Note: This dictation was prepared with Dragon dictation along with smaller phrase technology. Any transcriptional errors that result from this process are unintentional.

## 2018-04-25 NOTE — Consult Note (Addendum)
WOC Nurse wound consult note Reason for Consult: Consult requested for bilat legs Wound type: Pt has generalized hemosiderin staining to feet and legs.  There is a larger affected area to right leg. Dark reddish-purple bruise to posterior right leg; 3X3cm.  No wounds or drainage to BLE.  Dry scaly scabbed skin peels off easily. Dressing procedure/placement/frequency: No wounds requiring topical treatment. Float heels to reduce pressure.  Eucerin cream daily to decrease dryness and itching.  Discussed plan of care with patient and family member at the bedside. Please re-consult if further assistance is needed.  Thank-you,  Keith Mcgeeawn Keith Heroux MSN, RN, CWOCN, [place]CN-AP, CNS [phone]

## 2018-04-25 NOTE — Care Management Important Message (Signed)
Important Message  Patient Details  Name: Keith SlickerJoe L Kluck MRN: [id number] Date of Birth: [birth year]   Medicare Important Message Given:  Yes    Olegario MessierKathy A Luciann Gossett 04/25/2018, 11:36 AM

## 2018-04-25 NOTE — Progress Notes (Signed)
New referral for outpatient Palliative to follow at [facility] received from CSW Heyworthandace Garrison. Plan is for discharge today. Patient information faxed to referral. Dayna BarkerKaren Robertson RN, BSN, [facility] [place], hospital Liaison [phone]

## 2018-04-25 NOTE — Clinical Social Work Note (Signed)
Patient is medically ready for discharge today. CSW notified patient's daughter [name] at bedside. Daughter is in agreement with discharge to [facility] today. CSW notified Tresa EndoKelly at [facility] that patient will come today. RN will call report and call for EMS transport.   Ruthe Mannanandace Keilyn Nadal MSW, [address]CSWA [phone]

## 2018-04-25 NOTE — Progress Notes (Signed)
Report called to Marena ChancyBianca, LPN and EMS contacted for transport.  Orson Apeanielle Myla Mauriello, RN, BSN

## 2018-04-25 NOTE — Progress Notes (Signed)
[facility] - [place] at [facility]   PATIENT NAME: Keith [name]    MR#:  [id number]  DATE OF BIRTH:  [birth year]  SUBJECTIVE:  CHIEF COMPLAINT:   Chief Complaint  Patient presents with  . Fall   - remains in bed, left sided hemineglect noted. Able to eat when fed Not answering questions  REVIEW OF SYSTEMS:  Review of Systems  Unable to perform ROS: Dementia    DRUG ALLERGIES:  No Known Allergies  VITALS:  Blood pressure 122/88, pulse 66, temperature 98.6 F (37 C), temperature source Oral, resp. rate 15, height 6\' 2"  (1.88 m), weight 91.1 kg (200 lb 12.8 oz), SpO2 98 %.  PHYSICAL EXAMINATION:  Physical Exam  GENERAL:  83 y.o.-year-old patient lying in the bed with no acute distress.  EYES: Pupils equal, round, reactive to light and accommodation. No scleral icterus. Extraocular muscles intact.  HEENT: Head atraumatic, normocephalic. Oropharynx and nasopharynx clear.  NECK:  Supple, no jugular venous distention. No thyroid enlargement, no tenderness.  LUNGS: Normal breath sounds bilaterally, no wheezing, rales,rhonchi or crepitation. No use of accessory muscles of respiration. Decreased bibasilar breath sounds CARDIOVASCULAR: S1, S2 normal. No  rubs, or gallops. 3/6 systolic murmur present. ABDOMEN: Soft, nontender, nondistended. Bowel sounds present. No organomegaly or mass.  EXTREMITIES: No pedal edema, cyanosis, or clubbing.  NEUROLOGIC: Cranial nerves II through XII are intact. Left sided hemi neglect seen. Muscle strength 5/5 in all extremities. Global weakness noted. Sensation intact. Gait not checked.  PSYCHIATRIC: The patient is alert but disoriented.  SKIN: No obvious rash, lesion, or ulcer.    LABORATORY PANEL:   CBC Recent Labs  Lab 04/24/18 0424  WBC 5.1  HGB 10.4*  HCT 30.7*  PLT 112*   ------------------------------------------------------------------------------------------------------------------  Chemistries  Recent Labs    Lab 04/24/18 0424  NA 140  K 4.6  CL 107  CO2 27  GLUCOSE 99  BUN 23  CREATININE 1.77*  CALCIUM 8.3*   ------------------------------------------------------------------------------------------------------------------  Cardiac Enzymes No results for input(s): TROPONINI in the last 168 hours. ------------------------------------------------------------------------------------------------------------------  RADIOLOGY:  No results found.  EKG:  No orders found for this or any previous visit.  ASSESSMENT AND PLAN:   83 year old male with past medical history significant for advanced dementia, CAD, atrial fibrillation on Xarelto was brought in from assisted living facility secondary to a fall and subdural hematoma.  1.  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  Able to be fed -Appreciate neurosurgery consult.  No acute intervention recommended -Hold off anticoagulation at this time.  Received Kcentra for reversal on admission -Palliative care meeting appreciated. PT consult recommended. -Likely will need to go to assisted living with hospice services  2.  Dementia with mood changes-continue Namenda and Aricept -On Depakote for mood stabilization -Also on Remeron for sleep and depression  3.  BPH-on Flomax  4.  DVT prophylaxis-on teds and SCDs.  [facility] to evaluate patient today   All the records are reviewed and case discussed with Care Management/Social Workerr. Management plans discussed with the patient, family and they are in agreement.  CODE STATUS: DNR  TOTAL TIME TAKING CARE OF THIS PATIENT: 36 minutes.   POSSIBLE D/C IN 1-2 DAYS, DEPENDING ON CLINICAL CONDITION.   Enid Baas M.D on 04/25/2018 at 12:19 PM  Between 7am to 6pm - Pager - [phone]  After 6pm go to [email or web address] - password [email or web address]  [employer]  Office   [phone]  CC: Primary care physician; Patient, No Pcp Per

## 2018-04-25 NOTE — Progress Notes (Signed)
Patient transported via EMS to [facility].  Danielle Aidaly Cordner, RN, BSN 

## 2018-06-09 ENCOUNTER — Encounter: Payer: Self-pay | Admitting: Internal Medicine

## 2018-06-09 ENCOUNTER — Inpatient Hospital Stay
Admission: EM | Admit: 2018-06-09 | Discharge: 2018-06-12 | DRG: 603 | Disposition: A | Discharge status: Home Health | Payer: Medicare HMO | Attending: Family Medicine | Admitting: Family Medicine

## 2018-06-09 ENCOUNTER — Other Ambulatory Visit: Payer: Self-pay

## 2018-06-09 DIAGNOSIS — N400 Enlarged prostate without lower urinary tract symptoms: Secondary | ICD-10-CM | POA: Diagnosis present

## 2018-06-09 DIAGNOSIS — L02415 Cutaneous abscess of right lower limb: Principal | ICD-10-CM | POA: Diagnosis present

## 2018-06-09 DIAGNOSIS — F419 Anxiety disorder, unspecified: Secondary | ICD-10-CM | POA: Diagnosis present

## 2018-06-09 DIAGNOSIS — L0291 Cutaneous abscess, unspecified: Secondary | ICD-10-CM

## 2018-06-09 DIAGNOSIS — I482 Chronic atrial fibrillation: Secondary | ICD-10-CM | POA: Diagnosis present

## 2018-06-09 DIAGNOSIS — Z79899 Other long term (current) drug therapy: Secondary | ICD-10-CM

## 2018-06-09 DIAGNOSIS — Z8249 Family history of ischemic heart disease and other diseases of the circulatory system: Secondary | ICD-10-CM

## 2018-06-09 DIAGNOSIS — Z8679 Personal history of other diseases of the circulatory system: Secondary | ICD-10-CM | POA: Diagnosis not present

## 2018-06-09 DIAGNOSIS — L03115 Cellulitis of right lower limb: Secondary | ICD-10-CM

## 2018-06-09 DIAGNOSIS — N183 Chronic kidney disease, stage 3 (moderate): Secondary | ICD-10-CM | POA: Diagnosis present

## 2018-06-09 DIAGNOSIS — D696 Thrombocytopenia, unspecified: Secondary | ICD-10-CM | POA: Diagnosis present

## 2018-06-09 DIAGNOSIS — Z66 Do not resuscitate: Secondary | ICD-10-CM | POA: Diagnosis present

## 2018-06-09 DIAGNOSIS — F0390 Unspecified dementia without behavioral disturbance: Secondary | ICD-10-CM | POA: Diagnosis present

## 2018-06-09 DIAGNOSIS — I129 Hypertensive chronic kidney disease with stage 1 through stage 4 chronic kidney disease, or unspecified chronic kidney disease: Secondary | ICD-10-CM | POA: Diagnosis present

## 2018-06-09 DIAGNOSIS — Z789 Other specified health status: Secondary | ICD-10-CM

## 2018-06-09 HISTORY — DX: Benign prostatic hyperplasia without lower urinary tract symptoms: N40.0

## 2018-06-09 HISTORY — DX: Unspecified atrial fibrillation: I48.91

## 2018-06-09 HISTORY — DX: Anxiety disorder, unspecified: F41.9

## 2018-06-09 HISTORY — DX: Traumatic subdural hemorrhage with loss of consciousness of unspecified duration, initial encounter: S06.5X9A

## 2018-06-09 HISTORY — DX: Traumatic subdural hemorrhage with loss of consciousness status unknown, initial encounter: S06.5XAA

## 2018-06-09 HISTORY — DX: Unspecified dementia, unspecified severity, without behavioral disturbance, psychotic disturbance, mood disturbance, and anxiety: F03.90

## 2018-06-09 LAB — CBC WITH DIFFERENTIAL/PLATELET
BASOS ABS: 0 10*3/uL (ref 0–0.1)
Basophils Relative: 0 %
EOS ABS: 0.2 10*3/uL (ref 0–0.7)
Eosinophils Relative: 3 %
HCT: 34.5 % — ABNORMAL LOW (ref 40.0–52.0)
Hemoglobin: 11.9 g/dL — ABNORMAL LOW (ref 13.0–18.0)
Lymphocytes Relative: 14 %
Lymphs Abs: 1 10*3/uL (ref 1.0–3.6)
MCH: 35.4 pg — ABNORMAL HIGH (ref 26.0–34.0)
MCHC: 34.4 g/dL (ref 32.0–36.0)
MCV: 102.8 fL — ABNORMAL HIGH (ref 80.0–100.0)
MONO ABS: 0.6 10*3/uL (ref 0.2–1.0)
MONOS PCT: 8 %
Neutro Abs: 5.2 10*3/uL (ref 1.4–6.5)
Neutrophils Relative %: 75 %
PLATELETS: 125 10*3/uL — AB (ref 150–440)
RBC: 3.35 MIL/uL — ABNORMAL LOW (ref 4.40–5.90)
RDW: 13.6 % (ref 11.5–14.5)
WBC: 7 10*3/uL (ref 3.8–10.6)

## 2018-06-09 LAB — BASIC METABOLIC PANEL
ANION GAP: 5 (ref 5–15)
BUN: 19 mg/dL (ref 8–23)
CHLORIDE: 105 mmol/L (ref 98–111)
CO2: 28 mmol/L (ref 22–32)
Calcium: 8.4 mg/dL — ABNORMAL LOW (ref 8.9–10.3)
Creatinine, Ser: 1.67 mg/dL — ABNORMAL HIGH (ref 0.61–1.24)
GFR calc non Af Amer: 37 mL/min — ABNORMAL LOW (ref >60)
GFR, EST AFRICAN AMERICAN: 43 mL/min — AB (ref >60)
Glucose, Bld: 116 mg/dL — ABNORMAL HIGH (ref 70–99)
POTASSIUM: 5.1 mmol/L (ref 3.5–5.1)
Sodium: 138 mmol/L (ref 135–145)

## 2018-06-09 LAB — MRSA PCR SCREENING: MRSA by PCR: POSITIVE — AB

## 2018-06-09 MED ORDER — ENOXAPARIN SODIUM 40 MG/0.4ML ~~LOC~~ SOLN
40.0000 mg | SUBCUTANEOUS | Status: DC
  Administered 2018-06-09 – 2018-06-11 (×3): 40 mg via SUBCUTANEOUS
  Filled 2018-06-09 (×3): qty 0.4

## 2018-06-09 MED ORDER — VANCOMYCIN HCL IN DEXTROSE 1-5 GM/200ML-% IV SOLN
1000.0000 mg | Freq: Once | INTRAVENOUS | Status: AC
  Administered 2018-06-09: 1000 mg via INTRAVENOUS
  Filled 2018-06-09: qty 200

## 2018-06-09 MED ORDER — TAMSULOSIN HCL 0.4 MG PO CAPS
0.4000 mg | ORAL_CAPSULE | Freq: Every day | ORAL | Status: DC
  Administered 2018-06-10 – 2018-06-11 (×2): 0.4 mg via ORAL
  Filled 2018-06-09: qty 1

## 2018-06-09 MED ORDER — SODIUM CHLORIDE 0.9 % IV SOLN
INTRAVENOUS | Status: DC
  Administered 2018-06-09 – 2018-06-11 (×2): via INTRAVENOUS

## 2018-06-09 MED ORDER — ADULT MULTIVITAMIN W/MINERALS CH
1.0000 | ORAL_TABLET | Freq: Every day | ORAL | Status: DC
  Administered 2018-06-10 – 2018-06-12 (×3): 1 via ORAL
  Filled 2018-06-09 (×3): qty 1

## 2018-06-09 MED ORDER — POLYETHYLENE GLYCOL 3350 17 G PO PACK
17.0000 g | PACK | ORAL | Status: DC
  Administered 2018-06-10 – 2018-06-12 (×2): 17 g via ORAL
  Filled 2018-06-09 (×2): qty 1

## 2018-06-09 MED ORDER — OXYCODONE HCL 5 MG PO TABS: 5.0000 mg | ORAL_TABLET | ORAL | Status: DC | PRN

## 2018-06-09 MED ORDER — AMMONIUM LACTATE 12 % EX LOTN
1.0000 "application " | TOPICAL_LOTION | Freq: Every day | CUTANEOUS | Status: DC
  Administered 2018-06-09 – 2018-06-11 (×3): 1 via TOPICAL
  Filled 2018-06-09: qty 400

## 2018-06-09 MED ORDER — MEMANTINE HCL ER 28 MG PO CP24
28.0000 mg | ORAL_CAPSULE | Freq: Every day | ORAL | Status: DC
  Administered 2018-06-10 – 2018-06-12 (×3): 28 mg via ORAL
  Filled 2018-06-09 (×4): qty 1

## 2018-06-09 MED ORDER — SODIUM CHLORIDE 0.9 % IV SOLN
3.0000 g | Freq: Four times a day (QID) | INTRAVENOUS | Status: DC
  Administered 2018-06-09 – 2018-06-11 (×7): 3 g via INTRAVENOUS
  Filled 2018-06-09 (×10): qty 3

## 2018-06-09 MED ORDER — MUPIROCIN 2 % EX OINT
1.0000 "application " | TOPICAL_OINTMENT | Freq: Two times a day (BID) | CUTANEOUS | Status: DC
  Administered 2018-06-09 – 2018-06-11 (×5): 1 via NASAL
  Filled 2018-06-09: qty 22

## 2018-06-09 MED ORDER — DIVALPROEX SODIUM 125 MG PO CSDR
250.0000 mg | DELAYED_RELEASE_CAPSULE | Freq: Two times a day (BID) | ORAL | Status: DC
  Administered 2018-06-09 – 2018-06-12 (×6): 250 mg via ORAL
  Filled 2018-06-09 (×8): qty 2

## 2018-06-09 MED ORDER — VANCOMYCIN HCL 10 G IV SOLR
1250.0000 mg | INTRAVENOUS | Status: DC
  Administered 2018-06-09 – 2018-06-11 (×3): 1250 mg via INTRAVENOUS
  Filled 2018-06-09 (×4): qty 1250

## 2018-06-09 MED ORDER — ACETAMINOPHEN 650 MG RE SUPP: 650.0000 mg | Freq: Four times a day (QID) | RECTAL | Status: DC | PRN

## 2018-06-09 MED ORDER — CHLORHEXIDINE GLUCONATE CLOTH 2 % EX PADS
6.0000 | MEDICATED_PAD | Freq: Every day | CUTANEOUS | Status: DC
  Administered 2018-06-10 – 2018-06-12 (×3): 6 via TOPICAL

## 2018-06-09 MED ORDER — LIDOCAINE HCL (PF) 1 % IJ SOLN
10.0000 mL | Freq: Once | INTRAMUSCULAR | Status: AC
  Administered 2018-06-09: 10 mL via INTRADERMAL
  Filled 2018-06-09: qty 10

## 2018-06-09 MED ORDER — MIRTAZAPINE 15 MG PO TABS
7.5000 mg | ORAL_TABLET | Freq: Every day | ORAL | Status: DC
  Administered 2018-06-09 – 2018-06-11 (×3): 7.5 mg via ORAL
  Filled 2018-06-09 (×3): qty 1

## 2018-06-09 MED ORDER — ACETAMINOPHEN 325 MG PO TABS
650.0000 mg | ORAL_TABLET | Freq: Four times a day (QID) | ORAL | Status: DC | PRN
  Administered 2018-06-09 – 2018-06-11 (×4): 650 mg via ORAL
  Filled 2018-06-09 (×4): qty 2

## 2018-06-09 MED ORDER — DONEPEZIL HCL 5 MG PO TABS
10.0000 mg | ORAL_TABLET | Freq: Every day | ORAL | Status: DC
  Administered 2018-06-09 – 2018-06-11 (×3): 10 mg via ORAL
  Filled 2018-06-09 (×4): qty 2

## 2018-06-09 NOTE — Consult Note (Signed)
Pharmacy Antibiotic Note  Keith [name] is a 83 y.o. male admitted on 06/09/2018 with thigh abscess.  Pharmacy has been consulted for vancomycin/unasyn dosing.  Plan: Patient received 1g of vancomycin in the ED. I will give next dose in 6 hours for stacked dosing Vancomycin 1250mg  IV every 24 hours.  Goal trough 15-20 mcg/mL.  Trough prior to the 5th dose Unasyn 3g q 6 hrs  Height: 6\' 2"  (188 cm) Weight: 188 lb (85.3 kg) IBW/kg (Calculated) : 82.2  Temp (24hrs), Avg:98.5 F (36.9 C), Min:98.5 F (36.9 C), Max:98.5 F (36.9 C)  Recent Labs  Lab 06/09/18 1710  WBC 7.0  CREATININE 1.67*    Estimated Creatinine Clearance: 40.3 mL/min (A) (by C-G formula based on SCr of 1.67 mg/dL (H)).    No Known Allergies  Antimicrobials this admission: vancomycin 9/16 >>  unasyn 9/16 >>   Dose adjustments this admission:   Microbiology results: 9/16 wound:  Thank you for allowing pharmacy to be a part of this patient's care.  Keith [name], Pharm.D, BCPS Clinical Pharmacist 06/09/2018 7:02 PM

## 2018-06-09 NOTE — ED Notes (Signed)
[facility] gown and cleaning PT up.

## 2018-06-09 NOTE — ED Provider Notes (Signed)
[facility] Emergency Department Provider Note  ____________________________________________  Time seen: Approximately 5:47 PM  I have reviewed the triage vital signs and the nursing notes.   HISTORY  Chief Complaint Abscess  Level 5 Caveat: Portions of the History and Physical including HPI and review of systems are unable to be completely obtained due to patient being a poor historian due to dementia   HPI Keith [name] is a 83 y.o. male brought to the ED due to worsening redness and swelling of his right thigh over the past 4 days.  He was started on Bactrim by his outpatient primary care provider but unfortunately is has continued worsening.  No fever chills.  Decreased oral intake over the past few days.  Low energy, normally walks around and the last 2 days has been unable to engage much in mobility exercise.      No past medical history on file.   Patient Active Problem List   Diagnosis Date Noted  . Dementia with behavioral disturbance   . Slow transit constipation   . Palliative care by specialist   . Advance care planning   . Goals of care, counseling/discussion   . Intracranial bleeding (HCC) 04/22/2018     No past surgical history on file.   Prior to Admission medications   Medication Sig Start Date End Date Taking? Authorizing Provider  ammonium lactate (LAC-HYDRIN) 12 % lotion Apply 1 application topically daily as needed for dry skin (LEG WRAPPING).    [provider]  divalproex (DEPAKOTE SPRINKLE) 125 MG capsule Take 250 mg by mouth 2 (two) times daily.    [provider]  donepezil (ARICEPT) 10 MG tablet Take 10 mg by mouth at bedtime.    [provider]  LORazepam (ATIVAN) 0.5 MG tablet Take 1 tablet (0.5 mg total) by mouth every 6 (six) hours as needed for anxiety (AGITATION CONTROL). 04/25/18   Enid Baas, MD  memantine (NAMENDA XR) 28 MG CP24 24 hr capsule Take 28 mg by mouth daily.    [provider]  mirtazapine (REMERON) 7.5 MG tablet Take 7.5 mg by mouth at bedtime.    [provider]  Multiple Vitamin (MULTIVITAMIN) tablet Take 1 tablet by mouth daily.    [provider]  polyethylene glycol (MIRALAX / GLYCOLAX) packet Take 17 g by mouth daily.    [provider]  tamsulosin (FLOMAX) 0.4 MG CAPS capsule Take 0.4 mg by mouth daily after supper.    [provider]  traMADol (ULTRAM) 50 MG tablet Take 1 tablet (50 mg total) by mouth every 6 (six) hours as needed for moderate pain (GIVE IF PATIENT SHOWS SIGNS OF AGITATION- THIS MAY INDICATE PAIN THAT HE CANNOT COMMUNICATE). 04/25/18   Enid Baas, MD     Allergies Patient has no known allergies.   No family history on file.  Social History Social History   Tobacco Use  . Smoking status: Unknown If Ever Smoked  . Smokeless tobacco: Never Used  Substance Use Topics  . Alcohol use: Not Currently  . Drug use: Not Currently    Review of Systems  Level 5 Caveat: Portions of the History and Physical including HPI and review of systems are unable to be completely obtained due to patient being a poor historian    ____________________________________________   PHYSICAL EXAM:  VITAL SIGNS: ED Triage Vitals  Enc Vitals Group     BP 06/09/18 1557 137/78     Pulse Rate 06/09/18 1557  88     Resp 06/09/18 1557 16     Temp 06/09/18 1557 98.5 F (36.9 C)     Temp Source 06/09/18 1557 Oral     SpO2 06/09/18 1557 98 %     Weight 06/09/18 1558 182 lb (82.6 kg)     Height 06/09/18 1607 6\' 2"  (1.88 m)     Head Circumference --      Peak Flow --      Pain Score --      Pain Loc --      Pain Edu? --      Excl. in GC? --     Vital signs reviewed, nursing assessments reviewed.   Constitutional:   Awake, alert.  Not oriented non-toxic appearance. Eyes:   Conjunctivae are normal. EOMI. PERRL. ENT      Head:   Normocephalic and atraumatic.      Nose:   No  congestion/rhinnorhea.       Mouth/Throat:   Dry mucous membranes, no pharyngeal erythema. No peritonsillar mass.       Neck:   No meningismus. Full ROM. Hematological/Lymphatic/Immunilogical:   No cervical lymphadenopathy. Cardiovascular:   RRR. Symmetric bilateral radial and DP pulses.  No murmurs. Cap refill less than 2 seconds. Respiratory:   Normal respiratory effort without tachypnea/retractions. Breath sounds are clear and equal bilaterally. No wheezes/rales/rhonchi. Gastrointestinal:   Soft and nontender. Non distended. There is no CVA tenderness.  No rebound, rigidity, or guarding.  Musculoskeletal:   Normal range of motion in all extremities. No joint effusions.  Swelling erythema warmth tenderness of the right lateral thigh.  There is a macerated area centrally with fluctuance and purulent drainage. Neurologic:   Normal speech and language.  Motor grossly intact. No acute focal neurologic deficits are appreciated.  Skin:    Skin is warm, dry with cellulitis and abscess of right thigh as noted above.  Also in the mons pubis area there are 2 small 1 cm macerated skin lesions with purulent drainage.  ____________________________________________    LABS (pertinent positives/negatives) (all labs ordered are listed, but only abnormal results are displayed) Labs Reviewed  AEROBIC CULTURE (SUPERFICIAL SPECIMEN)  BASIC METABOLIC PANEL  CBC WITH DIFFERENTIAL/PLATELET   ____________________________________________   EKG    ____________________________________________    RADIOLOGY  No results found.  ____________________________________________   PROCEDURES Procedures  ____________________________________________    CLINICAL IMPRESSION / ASSESSMENT AND PLAN / ED COURSE  Pertinent labs & imaging results that were available during my care of the patient were reviewed by me and considered in my medical decision making (see chart for details).    Patient presents  with worsening redness swelling warmth and tenderness of the right thigh over the past 4 days despite Bactrim use.  Area of cellulitis has consolidated into a central abscess which is been drained in the ED.  There are 2 smaller areas of about 1 cm superficial abscess in the mons pubis area, both drained with a stab incision and probed and do not have a deeper space.  Due to outpatient treatment failure, age comorbidities I will start the patient on IV vancomycin and plan to hospitalize for further management.  Daughter at bedside agrees with the plan.  Patient is not septic.      ____________________________________________   FINAL CLINICAL IMPRESSION(S) / ED DIAGNOSES    Final diagnoses:  Abscess  Cellulitis of right thigh  Failure of outpatient treatment     ED Discharge Orders    None  Portions of this note were generated with dragon dictation software. Dictation errors may occur despite best attempts at proofreading.    Sharman Cheek, MD 06/09/18 1755

## 2018-06-09 NOTE — H&P (Signed)
[facility] - [place] at [facility]   PATIENT NAME: Keith GaussJoe [name]    MR#:  [id number]  DATE OF BIRTH:  1934/11/22  DATE OF ADMISSION:  06/09/2018  PRIMARY CARE PHYSICIAN: Dr Ernestina PennaBeth Hodge  REQUESTING/REFERRING PHYSICIAN: Dr Sharman CheekPhillip Stafford  CHIEF COMPLAINT:   Chief Complaint  Patient presents with  . Abscess    HISTORY OF PRESENT ILLNESS:  Keith GaussJoe [name]  is a 83 y.o. male sent into the hospital for an abscess of the right thigh.  Patient is unable to give any history secondary to dementia.  The patient's daughter had left by the time I got into the room.  I spoke briefly with her on the phone.  History obtained [facility].  ER physician drained abscess of the right thigh and sent off wound culture.  PAST MEDICAL HISTORY:   Past Medical History:  Diagnosis Date  . Anxiety   . Atrial fibrillation (HCC)   . BPH (benign prostatic hyperplasia)   . Dementia   . Subdural hematoma (HCC)     PAST SURGICAL HISTORY:   Past Surgical History:  Procedure Laterality Date  . NO PAST SURGERIES      SOCIAL HISTORY:   Social History   Tobacco Use  . Smoking status: Unknown If Ever Smoked  . Smokeless tobacco: Never Used  Substance Use Topics  . Alcohol use: Not Currently    FAMILY HISTORY:   Family History  Problem Relation Age of Onset  . Hypertension Father     DRUG ALLERGIES:  No Known Allergies  REVIEW OF SYSTEMS:  Unable to provide review of systems secondary to dementia  MEDICATIONS AT HOME:   Prior to Admission medications   Medication Sig Start Date End Date Taking? Authorizing Provider  ammonium lactate (LAC-HYDRIN) 12 % lotion Apply 1 application topically daily as needed for dry skin (LEG WRAPPING).    [provider]  divalproex (DEPAKOTE SPRINKLE) 125 MG capsule Take 250 mg by mouth 2 (two) times daily.    [provider]  donepezil (ARICEPT) 10 MG tablet Take 10 mg by mouth at  bedtime.    [provider]  LORazepam (ATIVAN) 0.5 MG tablet Take 1 tablet (0.5 mg total) by mouth every 6 (six) hours as needed for anxiety (AGITATION CONTROL). 04/25/18   Enid BaasKalisetti, Radhika, MD  memantine (NAMENDA XR) 28 MG CP24 24 hr capsule Take 28 mg by mouth daily.    [provider]  mirtazapine (REMERON) 7.5 MG tablet Take 7.5 mg by mouth at bedtime.    [provider]  Multiple Vitamin (MULTIVITAMIN) tablet Take 1 tablet by mouth daily.    [provider]  polyethylene glycol (MIRALAX / GLYCOLAX) packet Take 17 g by mouth daily.    [provider]  sulfamethoxazole-trimethoprim (BACTRIM DS,SEPTRA DS) 800-160 MG tablet Take 1 tablet by mouth 2 (two) times daily. 06/06/18 06/11/18  [provider]  tamsulosin (FLOMAX) 0.4 MG CAPS capsule Take 0.4 mg by mouth daily after supper.    [provider]  traMADol (ULTRAM) 50 MG tablet Take 1 tablet (50 mg total) by mouth every 6 (six) hours as needed for moderate pain (GIVE IF PATIENT SHOWS SIGNS OF AGITATION- THIS MAY INDICATE PAIN THAT HE CANNOT COMMUNICATE). 04/25/18   Enid BaasKalisetti, Radhika, MD   Medication reconciliation process still undergoing.  VITAL SIGNS:  Blood pressure 116/75, pulse 76, temperature 98.5 F (36.9 C), temperature source Oral, resp. rate 19, height 6\' 2"  (1.88 m),  weight 85.3 kg, SpO2 96 %.  PHYSICAL EXAMINATION:  GENERAL:  83 y.o.-year-old patient lying in the bed with no acute distress.  EYES: Pupils equal, round, reactive to light and accommodation. No scleral icterus.  HEENT: Head atraumatic, normocephalic. Oropharynx and nasopharynx clear.  NECK:  Supple, no jugular venous distention. No thyroid enlargement, no tenderness.  LUNGS: Normal breath sounds bilaterally, no wheezing, rales,rhonchi or crepitation. No use of accessory muscles of respiration.  CARDIOVASCULAR: S1, S2 normal. No murmurs, rubs, or gallops.  ABDOMEN: Soft, nontender, nondistended. Bowel  sounds present. No organomegaly or mass.  EXTREMITIES: 2+ pedal edema, no cyanosis, or clubbing.  NEUROLOGIC: Difficult to test secondary to dementia.  Patient moves his arms on his own. PSYCHIATRIC: The patient is alert.  SKIN: Chronic lower extremity discoloration bilaterally.  Right thigh covered with bandage but surrounding erythema around the bandage.  LABORATORY PANEL:   CBC Recent Labs  Lab 06/09/18 1710  WBC 7.0  HGB 11.9*  HCT 34.5*  PLT 125*   ------------------------------------------------------------------------------------------------------------------  Chemistries  Recent Labs  Lab 06/09/18 1710  NA 138  K 5.1  CL 105  CO2 28  GLUCOSE 116*  BUN 19  CREATININE 1.67*  CALCIUM 8.4*   ------------------------------------------------------------------------------------------------------------------    IMPRESSION AND PLAN:   1.  Abscess and cellulitis of the right thigh.  Failed outpatient treatment with Bactrim.  Patient placed on Unasyn and vancomycin.  Follow-up wound culture results. 2.  Chronic kidney disease stage III.  Continue to monitor 3.  Chronic thrombocytopenia 4.  Dementia without behavioral disturbance.  Continue psychiatric medications 5.  BPH on Flomax 6.  History of chronic atrial fibrillation 7.  History of subdural hematoma   All the records are reviewed and case discussed with ED provider. Management plans discussed with the patient, family and they are in agreement.  CODE STATUS: dnr  TOTAL TIME TAKING CARE OF THIS PATIENT: 40 minutes.    Alford Highland M.D on 06/09/2018 at 6:35 PM  Between 7am to 6pm - Pager - [phone]  After 6pm call admission pager [phone]  [facility] Office  [phone]  CC: Primary care physician; Dr. Ernestina Penna

## 2018-06-09 NOTE — ED Triage Notes (Signed)
Pt coming in by [facility] due to abscess to right thigh and left inner thigh. Family is concerned that these areas are infected. Areas are red, warm, and with drainage. Pt is demented at baseline and unable to answer questions. Pt is DNR.

## 2018-06-10 LAB — CBC
HCT: 32.6 % — ABNORMAL LOW (ref 40.0–52.0)
HEMOGLOBIN: 11.1 g/dL — AB (ref 13.0–18.0)
MCH: 34.9 pg — ABNORMAL HIGH (ref 26.0–34.0)
MCHC: 34.1 g/dL (ref 32.0–36.0)
MCV: 102.2 fL — AB (ref 80.0–100.0)
Platelets: 121 10*3/uL — ABNORMAL LOW (ref 150–440)
RBC: 3.19 MIL/uL — ABNORMAL LOW (ref 4.40–5.90)
RDW: 13.7 % (ref 11.5–14.5)
WBC: 5.7 10*3/uL (ref 3.8–10.6)

## 2018-06-10 LAB — BASIC METABOLIC PANEL
ANION GAP: 4 — AB (ref 5–15)
BUN: 19 mg/dL (ref 8–23)
CHLORIDE: 108 mmol/L (ref 98–111)
CO2: 28 mmol/L (ref 22–32)
Calcium: 8.2 mg/dL — ABNORMAL LOW (ref 8.9–10.3)
Creatinine, Ser: 1.56 mg/dL — ABNORMAL HIGH (ref 0.61–1.24)
GFR calc Af Amer: 46 mL/min — ABNORMAL LOW (ref >60)
GFR calc non Af Amer: 40 mL/min — ABNORMAL LOW (ref >60)
GLUCOSE: 98 mg/dL (ref 70–99)
POTASSIUM: 4.1 mmol/L (ref 3.5–5.1)
Sodium: 140 mmol/L (ref 135–145)

## 2018-06-10 NOTE — Progress Notes (Signed)
New referral for outpatient Palliative to follow at home received from [facility]. No discharge date at this time. Will also need address confirmation. CSW [facility] aware.  Dayna BarkerKaren Robertson RN, BSN, [facility], hospital Liaison [phone]

## 2018-06-10 NOTE — Progress Notes (Signed)
[name] they will not accept patient back unless patient pays 30-60 days up front. Per patient's daughter Keith [name] she does not want to pay privately for SNF and will take patient to a private residence in [place]. Daughter requested home health PT, RN, Management consultantN aide and Child psychotherapistsocial worker. Daughter also requested hospital bed, wheel chair and bedside commode. Per daughter she will get a walker for patient on her own. CSW also discussed hospice and palliative care. Daughter requested outpatient palliative care. CSW made [facility] liaison aware of above. RN case manager aware of above.  [facility]iley Kynzi Levay, LCSW [phone]

## 2018-06-10 NOTE — Progress Notes (Signed)
Talked with physician about wound care orders for wounds to right thigh and two on the anterior ABD.  Discussed possible wound care consult.  No new orders at this time.

## 2018-06-10 NOTE — Progress Notes (Signed)
[facility] - [place] at [facility]   PATIENT NAME: Keith [name]    MR#:  [id number]  DATE OF BIRTH:  February 28, 1935  SUBJECTIVE:  CHIEF COMPLAINT:   Chief Complaint  Patient presents with  . Abscess  Patient is demented at baseline, caregiver at the bedside  REVIEW OF SYSTEMS:  CONSTITUTIONAL: No fever, fatigue or weakness.  EYES: No blurred or double vision.  EARS, NOSE, AND THROAT: No tinnitus or ear pain.  RESPIRATORY: No cough, shortness of breath, wheezing or hemoptysis.  CARDIOVASCULAR: No chest pain, orthopnea, edema.  GASTROINTESTINAL: No nausea, vomiting, diarrhea or abdominal pain.  GENITOURINARY: No dysuria, hematuria.  ENDOCRINE: No polyuria, nocturia,  HEMATOLOGY: No anemia, easy bruising or bleeding SKIN: No rash or lesion. MUSCULOSKELETAL: No joint pain or arthritis.   NEUROLOGIC: No tingling, numbness, weakness.  PSYCHIATRY: No anxiety or depression.   ROS  DRUG ALLERGIES:  No Known Allergies  VITALS:  Blood pressure (!) 144/82, pulse 65, temperature 98.3 F (36.8 C), temperature source Axillary, resp. rate 16, height 6\' 2"  (1.88 m), weight 85.3 kg, SpO2 99 %.  PHYSICAL EXAMINATION:  GENERAL:  83 y.o.-year-old patient lying in the bed with no acute distress.  EYES: Pupils equal, round, reactive to light and accommodation. No scleral icterus. Extraocular muscles intact.  HEENT: Head atraumatic, normocephalic. Oropharynx and nasopharynx clear.  NECK:  Supple, no jugular venous distention. No thyroid enlargement, no tenderness.  LUNGS: Normal breath sounds bilaterally, no wheezing, rales,rhonchi or crepitation. No use of accessory muscles of respiration.  CARDIOVASCULAR: S1, S2 normal. No murmurs, rubs, or gallops.  ABDOMEN: Soft, nontender, nondistended. Bowel sounds present. No organomegaly or mass.  EXTREMITIES: No pedal edema, cyanosis, or clubbing.  NEUROLOGIC: Cranial nerves II through XII are intact. Muscle strength 5/5 in all  extremities. Sensation intact. Gait not checked.  PSYCHIATRIC: The patient is alert and oriented x 3.  SKIN: No obvious rash, lesion, or ulcer.   Physical Exam LABORATORY PANEL:   CBC Recent Labs  Lab 06/10/18 0358  WBC 5.7  HGB 11.1*  HCT 32.6*  PLT 121*   ------------------------------------------------------------------------------------------------------------------  Chemistries  Recent Labs  Lab 06/10/18 0358  NA 140  K 4.1  CL 108  CO2 28  GLUCOSE 98  BUN 19  CREATININE 1.56*  CALCIUM 8.2*   ------------------------------------------------------------------------------------------------------------------  Cardiac Enzymes No results for input(s): TROPONINI in the last 168 hours. ------------------------------------------------------------------------------------------------------------------  RADIOLOGY:  No results found.  ASSESSMENT AND PLAN:  Acute right thigh abscess with associated cellulitis  Status post I&D in the emergency room  Failed outpatient treatment with Bactrim  Continue empiric vancomycin/Unasyn, follow-up on cultures, probable discharge back to skilled nursing facility on tomorrow   *Chronic kidney disease stage III  Stable  Avoid nephrotoxic agents   *Chronic dementia  At baseline  Increase nursing care PRN, aspiration/fall/skin care precautions while in house   *Chronic thrombocytopenia Stable  *BPH  Stable on Flomax  *History of chronic atrial fibrillation Stable  *History of subdural hematoma Stable  Disposition back to nursing facility on tomorrow barring any complications  All the records are reviewed and case discussed with Care Management/Social Workerr. Management plans discussed with the patient, family and they are in agreement.  CODE STATUS: dnr  TOTAL TIME TAKING CARE OF THIS PATIENT: 45 minutes.     POSSIBLE D/C IN 1 DAYS, DEPENDING ON CLINICAL CONDITION.   Evelena AsaMontell D Salary M.D on 06/10/2018   Between  7am to 6pm - Pager - [phone]  After 6pm go  to [email or web address] - password [email or web address] [facility] [facility]  Office  [phone]  CC: Primary care physician; Patient, No Pcp Per  Note: This dictation was prepared with Dragon dictation along with smaller phrase technology. Any transcriptional errors that result from this process are unintentional.

## 2018-06-10 NOTE — Clinical Social Work Note (Signed)
Clinical Social Work Assessment  Patient Details  Name: Keith [name] MRN: [id number] Date of Birth: 1935/02/17  Date of referral:  06/10/18               Reason for consult:  Other (Comment Required), Discharge Planning(From [name] )                Permission sought to share information with:  Chartered certified accountant granted to share information::  Yes, Verbal Permission Granted  Name::      [name]   Agency::     Relationship::     Contact Information:     Housing/Transportation Living arrangements for the past 2 months:  [place], [place] of Information:  Adult Children, Facility Patient Interpreter Needed:  None Criminal Activity/Legal Involvement Pertinent to Current Situation/Hospitalization:  No - Comment as needed Significant Relationships:  Adult Children Lives with:  Facility Resident Do you feel safe going back to the place where you live?    Need for family participation in patient care:  Yes (Comment)  Care giving concerns:  Patient came into [name] from [name].    Social Worker assessment / plan:  Holiday representative ([place]) reviewed chart and noted that patient is from [name]. CSW contacted [employer] at [name]. Per Claiborne Billings patient [name]18 to [name] under traditional medicare. Per Claiborne Billings while patient was at [name] some how patient was switched to [name]. Per Claiborne Billings they tried to get authorization from [name] however [name] denied and then family appealed that decision and won. Per [name] paid for a little bit of rehab then cut patient off because he was not making progress with PT. Per Claiborne Billings patient is not rehabable and more custodial care. Per Claiborne Billings they have been working with [name] who states that patient will not qualify for medicaid and makes $22 over the limit. Per  Claiborne Billings patient's daughter refused to private pay for SNF. Per Claiborne Billings patient has no payer for SNF at this point.   CSW met with patient however he was pleasantly confused and a private duty caregiver was at bedside. CSW contacted patient's daughter Keith [name] [phone] to discuss D/C plan. Per daughter patient makes $22 over the limit to get medicaid and will be changing back to traditional medicare Oct. 1st, 2019. CSW explained that patient has no payer for SNF. Per daughter she hires private duty caregivers every day from 8 am to 4 pm and 6 pm to 8:30 pm. Per daughter her plan is once patient is discharged from [name] she will bring him to a private residence in [place] (her friend's house) and hire private duty caregivers. Daughter also requested home health. RN case manager aware of above. CSW will continue to follow and assist as needed.   Employment status:  Disabled (Comment on whether or not currently receiving Disability) Insurance information:  Managed Medicare PT Recommendations:  Not assessed at this time Information / Referral to community resources:  [place]  Patient/Family's Response to care:  Patient's daughter plans on bringing patient to a private residence.   Patient/Family's Understanding of and Emotional Response to Diagnosis, Current Treatment, and Prognosis:  Patient's daughter was very pleasant and thanked CSW for assistance.   Emotional Assessment Appearance:  Appears stated age Attitude/Demeanor/Rapport:  Unable to Assess Affect (typically observed):  Unable to Assess Orientation:  Oriented to Self, Fluctuating Orientation (Suspected and/or reported  Sundowners) Alcohol / Substance use:  Not Applicable Psych involvement (Current and /or in the community):  No (Comment)  Discharge Needs  Concerns to be addressed:  Discharge Planning Concerns Readmission within the last 30 days:  No Current discharge risk:  Dependent with Mobility, Cognitively  Impaired Barriers to Discharge:  Continued Medical Work up   [employer], Veronia Beets, LCSW 06/10/2018, 11:31 AM

## 2018-06-10 NOTE — [place] FL2 (Signed)
[place] MEDICAID FL2 LEVEL OF CARE SCREENING TOOL     IDENTIFICATION  Patient Name: Keith [name] Birthdate: [birth year] Sex: male Admission Date (Current Location): 06/09/2018  [place]   Facility and Address:  [name], [address], [place], [place] [address]      Provider Number: [id number]  Attending Physician Name and Address:  Keith [name]  Relative Name and Phone Number:       Current Level of Care: Hospital Recommended Level of Care: Skilled Nursing Facility Prior Approval Number:    Date Approved/Denied:   PASRR Number: ([id number])  Discharge Plan: SNF    Current Diagnoses: Patient Active Problem List   Diagnosis Date Noted  . Cellulitis 06/09/2018  . Dementia with behavioral disturbance   . Slow transit constipation   . Palliative care by specialist   . Advance care planning   . Goals of care, counseling/discussion   . Intracranial bleeding (HCC) 04/22/2018    Orientation RESPIRATION BLADDER Height & Weight     Self  Normal Incontinent Weight: 188 lb (85.3 kg) Height:  6\' 2"  (188 cm)  BEHAVIORAL SYMPTOMS/MOOD NEUROLOGICAL BOWEL NUTRITION STATUS      Continent Diet(Diet: Regular )  AMBULATORY STATUS COMMUNICATION OF NEEDS Skin   Extensive Assist Verbally Other (Comment)(wound on abdomen and right thigh. )                       Personal Care Assistance Level of Assistance  Bathing, Feeding, Dressing Bathing Assistance: Limited assistance Feeding assistance: Limited assistance Dressing Assistance: Limited assistance     Functional Limitations Info  Sight, Hearing, Speech Sight Info: Adequate Hearing Info: Adequate Speech Info: Adequate    SPECIAL CARE FACTORS FREQUENCY  PT (By licensed PT)     PT Frequency: (5)              Contractures      Additional Factors Info  Code Status, Allergies, Isolation Precautions Code Status Info: (DNR ) Allergies Info:  (No Known Allergies. )     Isolation Precautions Info: (MRSA Nasal Swab. )     Current Medications (06/10/2018):  This is the current hospital active medication list Current Facility-Administered Medications  Medication Dose [name] Frequency Provider Last Rate Last Dose  . 0.9 %  sodium chloride infusion   Intravenous Continuous Keith [name] 30 mL/hr at 06/10/18 0300    . acetaminophen (TYLENOL) tablet 650 mg  650 mg Oral Q6H PRN Keith [name]   650 mg at 06/09/18 2056   Or  . acetaminophen (TYLENOL) suppository 650 mg  650 mg Rectal Q6H PRN Wieting, Richard, [name]      . ammonium lactate (LAC-HYDRIN) 12 % lotion 1 application  1 application Topical QHS Keith [name]   1 application at 06/09/18 2107  . Ampicillin-Sulbactam (UNASYN) 3 g in sodium chloride 0.9 % 100 mL IVPB  3 g Intravenous Q6H Keith [name] 200 mL/hr at 06/10/18 0905 3 g at 06/10/18 0905  . Chlorhexidine Gluconate Cloth 2 % PADS 6 each  6 each Topical Q0600 Keith [name]   6 each at 06/10/18 0501  . divalproex (DEPAKOTE SPRINKLE) capsule 250 mg  250 mg Oral BID Keith [name]   250 mg at 06/10/18 0908  . donepezil (ARICEPT) tablet 10 mg  10 mg Oral QHS Keith [name]   10 mg at 06/09/18 2057  . enoxaparin (LOVENOX) injection 40 mg  40 mg Subcutaneous Q24H Keith Highland, [name]   40 mg at 06/09/18 2057  . memantine (NAMENDA XR) 24 hr capsule 28 mg  28 mg Oral Daily Keith Highland, [name]   28 mg at 06/10/18 0908  . mirtazapine (REMERON) tablet 7.5 mg  7.5 mg Oral QHS Keith Highland, [name]   7.5 mg at 06/09/18 2057  . multivitamin with minerals tablet 1 tablet  1 tablet Oral Daily Keith Highland, [name]   1 tablet at 06/10/18 0908  . mupirocin ointment (BACTROBAN) 2 % 1 application  1 application Nasal BID Keith Highland, [name]   1 application at 06/10/18 0908  . oxyCODONE (Oxy IR/ROXICODONE) immediate release tablet 5 mg  5 mg Oral Q4H PRN Wieting, Richard, [name]      . polyethylene glycol  (MIRALAX / GLYCOLAX) packet 17 g  17 g Oral Keith [name]   17 g at 06/10/18 0908  . tamsulosin (FLOMAX) capsule 0.4 mg  0.4 mg Oral QPC supper Keith Highland, [name]      . vancomycin (VANCOCIN) 1,250 mg in sodium chloride 0.9 % 250 mL IVPB  1,250 mg Intravenous Q24H Keith [name]   Stopped at 06/09/18 2326     Discharge Medications: Please see discharge summary for a list of discharge medications.  Relevant Imaging Results:  Relevant Lab Results:   Additional Information (SSN: [id number])  Keith [name], Keith Crocker, LCSW

## 2018-06-10 NOTE — Care Management Note (Signed)
Case Management Note  Patient Details  Name: Keith [name] MRN: [id number] Date of Birth: 11/27/1934  Subjective/Objective:  Acute right thigh abscess with cellulitis, chronic dementia. Plan is discharge home with daughter Thursday. He will need a hospital bed, wheel chair and bedside commode. Ordered from [place] with [facility] for delivery prior to discharge. Provided a list for home care providers, Referral to [facility] for home health RN, PT, HHA and SW. Patient has no PCP. RNCM is working on finding patient a PCP prior to DC on Thursday. Will ask Dr. Judithann SheenSparks to sign home health orders until PCP appointment.   Need to clarify correct address with daughter as well.                   Action/Plan:   Expected Discharge Date:                  Expected Discharge Plan:     In-House Referral:  Clinical Social Work  Discharge planning Services  CM Consult  Post Acute Care Choice:  Durable Medical Equipment, Home Health Choice offered to:  Adult Children  DME Arranged:  Bedside commode, Wheelchair manual, Hospital bed DME Agency:  [facility]  HH Arranged:  RN, PT, Social Work, Nurse's Aide HH Agency:  [facility]  Status of Service:  In process, will continue to follow  If discussed at Long Length of Stay Meetings, dates discussed:    Additional Comments:  Keith MemosLisa M Raiven Belizaire, RN 06/10/2018, 3:53 PM

## 2018-06-11 MED ORDER — HYDRALAZINE HCL 20 MG/ML IJ SOLN
10.0000 mg | INTRAMUSCULAR | Status: DC | PRN
  Administered 2018-06-12: 10 mg via INTRAVENOUS
  Filled 2018-06-11: qty 1

## 2018-06-11 NOTE — Progress Notes (Signed)
[facility] Address: [address], [place], [place] [address]  [address]CP: N/A at this time, CM has reached out to Dr. Judithann SheenSparks to ask if he will sign HH orders until PCP has been established.   If patient discharges after hours, please call [phone].   Dimple CaseyJason E Hinton 06/11/2018, 10:55 AM

## 2018-06-11 NOTE — Progress Notes (Signed)
[facility] - [place] at [facility]   PATIENT NAME: Keith [name]    MR#:  [id number]  DATE OF BIRTH:  1935/07/12  SUBJECTIVE:  CHIEF COMPLAINT:   Chief Complaint  Patient presents with  . Abscess  Patient without complaint, daughter at the bedside  REVIEW OF SYSTEMS:  CONSTITUTIONAL: No fever, fatigue or weakness.  EYES: No blurred or double vision.  EARS, NOSE, AND THROAT: No tinnitus or ear pain.  RESPIRATORY: No cough, shortness of breath, wheezing or hemoptysis.  CARDIOVASCULAR: No chest pain, orthopnea, edema.  GASTROINTESTINAL: No nausea, vomiting, diarrhea or abdominal pain.  GENITOURINARY: No dysuria, hematuria.  ENDOCRINE: No polyuria, nocturia,  HEMATOLOGY: No anemia, easy bruising or bleeding SKIN: No rash or lesion. MUSCULOSKELETAL: No joint pain or arthritis.   NEUROLOGIC: No tingling, numbness, weakness.  PSYCHIATRY: No anxiety or depression.   ROS  DRUG ALLERGIES:  No Known Allergies  VITALS:  Blood pressure (!) 152/70, pulse 65, temperature 98.7 F (37.1 C), temperature source Axillary, resp. rate 16, height 6\' 2"  (1.88 m), weight 85.3 kg, SpO2 97 %.  PHYSICAL EXAMINATION:  GENERAL:  83 y.o.-year-old patient lying in the bed with no acute distress.  EYES: Pupils equal, round, reactive to light and accommodation. No scleral icterus. Extraocular muscles intact.  HEENT: Head atraumatic, normocephalic. Oropharynx and nasopharynx clear.  NECK:  Supple, no jugular venous distention. No thyroid enlargement, no tenderness.  LUNGS: Normal breath sounds bilaterally, no wheezing, rales,rhonchi or crepitation. No use of accessory muscles of respiration.  CARDIOVASCULAR: S1, S2 normal. No murmurs, rubs, or gallops.  ABDOMEN: Soft, nontender, nondistended. Bowel sounds present. No organomegaly or mass.  EXTREMITIES: No pedal edema, cyanosis, or clubbing.  NEUROLOGIC: Cranial nerves II through XII are intact. Muscle strength 5/5 in all extremities.  Sensation intact. Gait not checked.  PSYCHIATRIC: The patient is alert and oriented x 3.  SKIN: No obvious rash, lesion, or ulcer.   Physical Exam LABORATORY PANEL:   CBC Recent Labs  Lab 06/10/18 0358  WBC 5.7  HGB 11.1*  HCT 32.6*  PLT 121*   ------------------------------------------------------------------------------------------------------------------  Chemistries  Recent Labs  Lab 06/10/18 0358  NA 140  K 4.1  CL 108  CO2 28  GLUCOSE 98  BUN 19  CREATININE 1.56*  CALCIUM 8.2*   ------------------------------------------------------------------------------------------------------------------  Cardiac Enzymes No results for input(s): TROPONINI in the last 168 hours. ------------------------------------------------------------------------------------------------------------------  RADIOLOGY:  No results found.  ASSESSMENT AND PLAN:  *Acute right thigh abscess with associated cellulitis  Markedly improved Status post I&D in the emergency room  Failed outpatient treatment with Bactrim  Continue empiric vancomycin, discontinue Unasyn, follow-up on cultures/sensitivities, discharge to home with family on tomorrow barring any complications   *Chronic kidney disease stage III  Stable  Avoid nephrotoxic agents   *Chronic dementia  At baseline  Continue increased nursing care PRN, aspiration/fall/skin care precautions while in house   *Chronic thrombocytopenia Stable  *BPH  Stable on Flomax  *History of chronic atrial fibrillation Stable  *History of subdural hematoma Stable  *Hypertension, chronic Hydralazine as needed, vitals per routine, make changes as per necessary  Disposition to home in care of family on tomorrow barring any complications   All the records are reviewed and case discussed with Care Management/Social Workerr. Management plans discussed with the patient, family and they are in agreement.  CODE STATUS: dnr  TOTAL TIME  TAKING CARE OF THIS PATIENT: 40 minutes.     POSSIBLE D/C IN 1 DAYS, DEPENDING ON CLINICAL CONDITION.  Evelena AsaMontell D Erian Lariviere M.D on 06/11/2018   Between 7am to 6pm - Pager - [phone]  After 6pm go to [email or web address] - password [email or web address]  [facility] [place] [facility]  Office  [phone]  CC: Primary care physician; Patient, No Pcp Per  Note: This dictation was prepared with Dragon dictation along with smaller phrase technology. Any transcriptional errors that result from this process are unintentional.

## 2018-06-11 NOTE — Care Management (Signed)
Mr. Earl LitesGregory spends 60% of his time in bed, Problems with dysphagia from old subdural hematomia. Sometimes has choking or gagging reflexes.  Head of the bed needs to be elevated 30% or better to prevent these complications. Bed wedges do not provide adequate elevation to resolve issues with aspiration. Mr. Earl LitesGregory requires frequent changes in body positions and positioning which cannot be achieved from a regular bed.  Gwenette GreetBrenda S Taye Cato RN MSN CCM Care Management [phone]

## 2018-06-12 ENCOUNTER — Telehealth: Payer: Self-pay | Admitting: Nurse Practitioner

## 2018-06-12 LAB — GLUCOSE, CAPILLARY
GLUCOSE-CAPILLARY: 75 mg/dL (ref 70–99)
GLUCOSE-CAPILLARY: 80 mg/dL (ref 70–99)
Glucose-Capillary: 63 mg/dL — ABNORMAL LOW (ref 70–99)

## 2018-06-12 LAB — AEROBIC CULTURE  (SUPERFICIAL SPECIMEN)

## 2018-06-12 LAB — AEROBIC CULTURE W GRAM STAIN (SUPERFICIAL SPECIMEN)

## 2018-06-12 MED ORDER — ACETAMINOPHEN 325 MG PO TABS: 650.0000 mg | ORAL_TABLET | Freq: Four times a day (QID) | ORAL | 0 refills | Status: AC | PRN

## 2018-06-12 MED ORDER — LACTINEX PO CHEW: 1.0000 | CHEWABLE_TABLET | Freq: Three times a day (TID) | ORAL | 0 refills | Status: AC

## 2018-06-12 MED ORDER — CLINDAMYCIN HCL 300 MG PO CAPS: 300.0000 mg | ORAL_CAPSULE | Freq: Three times a day (TID) | ORAL | 0 refills | Status: DC

## 2018-06-12 NOTE — Discharge Summary (Signed)
[facility] - [place] at [facility]   PATIENT NAME: Keith [name]    MR#:  [id number]  DATE OF BIRTH:  1934-10-11  DATE OF ADMISSION:  06/09/2018 ADMITTING PHYSICIAN: Alford Highland, MD  DATE OF DISCHARGE: No discharge date for patient encounter.  PRIMARY CARE PHYSICIAN: Patient, No Pcp Per    ADMISSION DIAGNOSIS:  Abscess [L02.91] Cellulitis of right thigh [L03.115] Failure of outpatient treatment [Z78.9]  DISCHARGE DIAGNOSIS:  Active Problems:   Cellulitis   SECONDARY DIAGNOSIS:   Past Medical History:  Diagnosis Date  . Anxiety   . Atrial fibrillation (HCC)   . BPH (benign prostatic hyperplasia)   . Dementia   . Subdural hematoma [facility])     HOSPITAL COURSE:  *Acute right thigh abscess with associated cellulitis  Markedly improved Status post I&D in the emergency room  Failed outpatient treatment with Bactrim  Treated with empiric vancomycin/Unasyn while in house, wound cultures noted for staph aureus-sensitivities still pending at the time of discharge, will be discharged on clindamycin for continued treatment, patient follow with primary care provider in 3 to 5 days for reevaluation  *Chronic kidney disease stage III  Stable  Avoided nephrotoxic agents  *Chronic dementia  At baseline  Provided increased nursing care PRN, aspiration/fall/skin care precautions while in house  *Chronic thrombocytopenia Stable  *BPH Stable onFlomax  *History of chronic atrial fibrillation Stable  *History of subdural hematoma Stable  *Hypertension, chronic Stable on current regiment   DISCHARGE CONDITIONS:   stable  CONSULTS OBTAINED:    DRUG ALLERGIES:  No Known Allergies  DISCHARGE MEDICATIONS:   Allergies as of 06/12/2018   No Known Allergies     Medication List    STOP taking these medications   sulfamethoxazole-trimethoprim 800-160 MG tablet Commonly known as:  BACTRIM DS,SEPTRA DS   traMADol 50 MG  tablet Commonly known as:  ULTRAM     TAKE these medications   acetaminophen 325 MG tablet Commonly known as:  TYLENOL Take 2 tablets (650 mg total) by mouth every 6 (six) hours as needed for mild pain (or Fever >/= 101).   ammonium lactate 12 % lotion Commonly known as:  LAC-HYDRIN Apply 1 application topically every evening.   clindamycin 300 MG capsule Commonly known as:  CLEOCIN Take 1 capsule (300 mg total) by mouth 3 (three) times daily.   divalproex 125 MG capsule Commonly known as:  DEPAKOTE SPRINKLE Take 250 mg by mouth 2 (two) times daily.   donepezil 10 MG tablet Commonly known as:  ARICEPT Take 10 mg by mouth at bedtime.   lactobacillus acidophilus & bulgar chewable tablet Chew 1 tablet by mouth 3 (three) times daily with meals.   mirtazapine 7.5 MG tablet Commonly known as:  REMERON Take 7.5 mg by mouth at bedtime.   multivitamin tablet Take 1 tablet by mouth daily.   NAMENDA XR 28 MG Cp24 24 hr capsule Generic drug:  memantine Take 28 mg by mouth daily.   polyethylene glycol packet Commonly known as:  MIRALAX / GLYCOLAX Take 17 g by mouth every other day.   tamsulosin 0.4 MG Caps capsule Commonly known as:  FLOMAX Take 0.4 mg by mouth daily after supper.            Durable Medical Equipment  (From admission, onward)         Start     Ordered   06/11/18 1233  For home use only DME high strength lightweight manual wheelchair with seat cushion  Once  Comments:  Patient suffers from Dementia which impairs their ability to perform daily activities like bathing in the home.  A walker will not resolve issue with performing activities of daily living. A wheelchair will allow patient to safely perform daily activities.  (THEN ONE OF THESE TWO:) Patient self-propels the wheelchair while engaging in frequent activities such as laundry which cannot be performed in a standard or lightweight wheelchair due to the weight of the chair. Accessories:  elevating leg rests (ELRs), wheel locks, extensions and anti-tippers.   06/11/18 1234   06/11/18 1232  For home use only DME Bedside commode  Once    Question:  Patient needs a bedside commode to treat with the following condition  Answer:  Dementia   06/11/18 1234   06/11/18 1230  For home use only DME Hospital bed  Once    Question Answer Comment  The above medical condition requires: Patient requires the ability to reposition frequently   Head must be elevated greater than: 30 degrees   Bed type Semi-electric      06/11/18 1234           DISCHARGE INSTRUCTIONS:  If you experience worsening of your admission symptoms, develop shortness of breath, life threatening emergency, suicidal or homicidal thoughts you must seek medical attention immediately by calling 911 or calling your MD immediately  if symptoms less severe.  You Must read complete instructions/literature along with all the possible adverse reactions/side effects for all the Medicines you take and that have been prescribed to you. Take any new Medicines after you have completely understood and accept all the possible adverse reactions/side effects.   Please note  You were cared for by a hospitalist during your hospital stay. If you have any questions about your discharge medications or the care you received while you were in the hospital after you are discharged, you can call the unit and asked to speak with the hospitalist on call if the hospitalist that took care of you is not available. Once you are discharged, your primary care physician will handle any further medical issues. Please note that NO REFILLS for any discharge medications will be authorized once you are discharged, as it is imperative that you return to your primary care physician (or establish a relationship with a primary care physician if you do not have one) for your aftercare needs so that they can reassess your need for medications and monitor your lab  values.    Today   CHIEF COMPLAINT:   Chief Complaint  Patient presents with  . Abscess    HISTORY OF PRESENT ILLNESS:   83 y.o. male sent into the hospital for an abscess of the right thigh.  Patient is unable to give any history secondary to dementia.  The patient's daughter had left by the time I got into the room.  I spoke briefly with her on the phone.  History obtained from records sent from [facility] health care.  ER physician drained abscess of the right thigh and sent off wound culture.  VITAL SIGNS:  Blood pressure (!) 161/100, pulse 60, temperature (!) 97.5 F (36.4 C), temperature source Oral, resp. rate 12, height 6\' 2"  (1.88 m), weight 85.3 kg, SpO2 100 %.  I/O:  No intake or output data in the 24 hours ending 06/12/18 1036  PHYSICAL EXAMINATION:  GENERAL:  83 y.o.-year-old patient lying in the bed with no acute distress.  EYES: Pupils equal, round, reactive to light and accommodation. No scleral icterus. Extraocular  muscles intact.  HEENT: Head atraumatic, normocephalic. Oropharynx and nasopharynx clear.  NECK:  Supple, no jugular venous distention. No thyroid enlargement, no tenderness.  LUNGS: Normal breath sounds bilaterally, no wheezing, rales,rhonchi or crepitation. No use of accessory muscles of respiration.  CARDIOVASCULAR: S1, S2 normal. No murmurs, rubs, or gallops.  ABDOMEN: Soft, non-tender, non-distended. Bowel sounds present. No organomegaly or mass.  EXTREMITIES: No pedal edema, cyanosis, or clubbing.  NEUROLOGIC: Cranial nerves II through XII are intact. Muscle strength 5/5 in all extremities. Sensation intact. Gait not checked.  PSYCHIATRIC: The patient is alert and oriented x 3.  SKIN: No obvious rash, lesion, or ulcer.   DATA REVIEW:   CBC Recent Labs  Lab 06/10/18 0358  WBC 5.7  HGB 11.1*  HCT 32.6*  PLT 121*    Chemistries  Recent Labs  Lab 06/10/18 0358  NA 140  K 4.1  CL 108  CO2 28  GLUCOSE 98  BUN 19  CREATININE 1.56*   CALCIUM 8.2*    Cardiac Enzymes No results for input(s): TROPONINI in the last 168 hours.  Microbiology Results  Results for orders placed or performed during the hospital encounter of 06/09/18  Wound or Superficial Culture     Status: None (Preliminary result)   Collection Time: 06/09/18  5:10 PM  Result Value Ref Range Status   Specimen Description   Final    THIGH Performed at [facility], [address] [place], [place] [address]    Special Requests   Final    NONE Performed at [facility], [address] [place], [place] [address]    Gram Stain   Final    MODERATE WBC PRESENT,BOTH PMN AND MONONUCLEAR FEW GRAM POSITIVE COCCI    Culture   Final    FEW STAPHYLOCOCCUS AUREUS SUSCEPTIBILITIES TO FOLLOW Performed at [facility], [address] [place], [place] [address]    Report Status PENDING  Incomplete  MRSA PCR Screening     Status: Abnormal   Collection Time: 06/09/18  8:03 PM  Result Value Ref Range Status   MRSA by PCR POSITIVE (A) NEGATIVE Final    Comment:        The GeneXpert MRSA Assay (FDA approved for NASAL specimens only), is one component of a comprehensive MRSA colonization surveillance program. It is not intended to diagnose MRSA infection nor to guide or monitor treatment for MRSA infections. RESULT CALLED TO, READ BACK BY AND VERIFIED WITH: C/VIVIAN ALI @2137  06/09/18 [facility] Performed at [facility], [address] [place], [place] [address]     RADIOLOGY:  No results found.  EKG:  No orders found for this or any previous visit.    Management plans discussed with the patient, family and they are in agreement.  CODE STATUS:     Code Status Orders  (From admission, onward)         Start     Ordered   06/09/18 1832  Do not attempt resuscitation (DNR)  Continuous    Question Answer Comment  In the event of cardiac or respiratory ARREST Do not call a "code blue"   In the event of cardiac or  respiratory ARREST Do not perform Intubation, CPR, defibrillation or ACLS   In the event of cardiac or respiratory ARREST Use medication by any route, position, wound care, and other measures to relive pain and suffering. May use oxygen, suction and manual treatment of airway obstruction as needed for comfort.   Comments  nurse may pronounce      06/09/18 1833        Code Status History    Date Active Date Inactive Code Status Order ID Comments User Context   04/22/2018 2112 04/25/2018 2215 DNR [id number]  Shaune Pollack, MD Inpatient    Advance Directive Documentation     Most Recent Value  Type of Advance Directive  Out of facility DNR (pink MOST or yellow form)  Pre-existing out of facility DNR order (yellow form or pink MOST form)  Yellow form placed in chart (order not valid for inpatient use)  "MOST" Form in Place?  -      TOTAL TIME TAKING CARE OF THIS PATIENT: 45 minutes.    Evelena Asa Taysen Bushart M.D on 06/12/2018 at 10:36 AM  Between 7am to 6pm - Pager - [phone]  After 6pm go to [email or web address] - password [email or web address]  [facility] [place] [facility]  Office  [phone]  CC: Primary care physician; Patient, No Pcp Per   Note: This dictation was prepared with Dragon dictation along with smaller phrase technology. Any transcriptional errors that result from this process are unintentional.

## 2018-06-12 NOTE — Care Management Note (Signed)
Case Management Note  Patient Details  Name: Keith [name] MRN: [id number] Date of Birth: Jun 10, 1935  Subjective/Objective:  [facility] Dr. Judithann SheenSparks agreeable to sign initial home health orders.  Appointment placed on Follow up appointments. Updated Daughter, Keith [name] of appointment date and time. She is agreeable to POC.  Requested Keith [name] with [facility] call daughter with information of delivery of DME. Daughter will transport patient home by car.                 Action/Plan:    Expected Discharge Date:                  Expected Discharge Plan:     In-House Referral:  Clinical Social Work  Discharge planning Services  CM Consult  Post Acute Care Choice:  Durable Medical Equipment, Home Health Choice offered to:  Adult Children  DME Arranged:  Bedside commode, Wheelchair manual, Hospital bed DME Agency:  [facility]  HH Arranged:  RN, PT, Social Work, Nurse's Aide HH Agency:  [facility]  Status of Service:  In process, will continue to follow  If discussed at Long Length of Stay Meetings, dates discussed:    Additional Comments:  Keith MemosLisa M Ivan Lacher, RN 06/12/2018, 10:05 AM

## 2018-06-12 NOTE — Progress Notes (Signed)
Assisted private sitter with incontinent care. Applied dressings to inguinal wounds.

## 2018-06-12 NOTE — Care Management Note (Signed)
Case Management Note  Patient Details  Name: Jenene SlickerJoe L Luginbill MRN: [id number] Date of Birth: Aug 09, 1935  Subjective/Objective:   Discharging today                 Action/Plan: Barbara CowerJason with [facility] notified of discharge.   Expected Discharge Date:                  Expected Discharge Plan:  Home w Home Health Services  In-House Referral:  Clinical Social Work  Discharge planning Services  CM Consult  Post Acute Care Choice:  Durable Medical Equipment, Home Health Choice offered to:  Adult Children  DME Arranged:  Bedside commode, Wheelchair manual, Hospital bed DME Agency:  [facility]  HH Arranged:  RN, PT, Social Work, Nurse's Aide HH Agency:  [facility]  Status of Service:  Completed, signed off  If discussed at [employer] of [employer] Meetings, dates discussed:    Additional Comments:  Marily MemosLisa M Anay Walter, RN 06/12/2018, 10:33 AM

## 2018-06-12 NOTE — Telephone Encounter (Signed)
Christy with Hospice said pt is currently in palliative care but family wants to continue palliative care at home.  Pt is scheduled to establish here on 10/8.  She just wanted to make sure that she could not get an order before he established and needs to document why there will be a lapse in care.  Please call [phone]

## 2018-06-12 NOTE — Progress Notes (Signed)
Reviewed Discharge summary with daughter/Legal Guardian. Verbal understanding. Dressing changed prior to transport. Daughter asking for hospital items prior to discharge.

## 2018-06-12 NOTE — Care Management (Cosign Needed)
Patient suffers from acute right thigh abscess with associated cellulitis and an old subdural hematoma  which impairs his ability to perform daily activities like toileting, feeding, dressing, performing personal care, grooming in the home.  A walker will not resolve                                  issue with performing activities of daily living.  A wheelchair will allow patient to safely perform daily activities.     Patient can safely propel the wheelchair in the home or has a caregiver who can provide assistance.

## 2018-06-13 NOTE — Telephone Encounter (Signed)
No order prior to establishing care.  However, we will provide the order on the day of his visit if it is deemed appropriate to continue.

## 2018-06-16 ENCOUNTER — Telehealth: Payer: Self-pay

## 2018-06-16 NOTE — Telephone Encounter (Signed)
EMMI Follow-up: Noted on the report that the patient had some questions about discharge paperwork and had unfilled Rx's.  I talked with [name] (daughter-POA - as pt. Unable to talk) and she said the last Rx. would be in today after 5 pm and she will pick that up for him.  She was aware of follow-up appointment.  Home Health RN came out on Saturday, today they came in to give him a bath, and occupational therapy would be there soon and get a schedule coordinated so no other needs.  I let her know there would be a second automated call with a different series of questions and if she had any other concerns to let us know.

## 2018-06-20 ENCOUNTER — Other Ambulatory Visit: Payer: Self-pay

## 2018-06-20 ENCOUNTER — Encounter: Payer: Self-pay | Admitting: Emergency Medicine

## 2018-06-20 ENCOUNTER — Emergency Department: Payer: Medicare HMO

## 2018-06-20 ENCOUNTER — Observation Stay
Admission: EM | Admit: 2018-06-20 | Discharge: 2018-06-21 | Disposition: A | Discharge status: Home / Self Care | Payer: Medicare HMO | Attending: Internal Medicine | Admitting: Internal Medicine

## 2018-06-20 DIAGNOSIS — N400 Enlarged prostate without lower urinary tract symptoms: Secondary | ICD-10-CM | POA: Diagnosis not present

## 2018-06-20 DIAGNOSIS — X58XXXA Exposure to other specified factors, initial encounter: Secondary | ICD-10-CM | POA: Insufficient documentation

## 2018-06-20 DIAGNOSIS — F0391 Unspecified dementia with behavioral disturbance: Secondary | ICD-10-CM | POA: Insufficient documentation

## 2018-06-20 DIAGNOSIS — R55 Syncope and collapse: Secondary | ICD-10-CM

## 2018-06-20 DIAGNOSIS — Z66 Do not resuscitate: Secondary | ICD-10-CM | POA: Insufficient documentation

## 2018-06-20 DIAGNOSIS — Z8249 Family history of ischemic heart disease and other diseases of the circulatory system: Secondary | ICD-10-CM | POA: Insufficient documentation

## 2018-06-20 DIAGNOSIS — S065X9A Traumatic subdural hemorrhage with loss of consciousness of unspecified duration, initial encounter: Secondary | ICD-10-CM | POA: Diagnosis present

## 2018-06-20 DIAGNOSIS — I4891 Unspecified atrial fibrillation: Secondary | ICD-10-CM | POA: Insufficient documentation

## 2018-06-20 DIAGNOSIS — S065XAA Traumatic subdural hemorrhage with loss of consciousness status unknown, initial encounter: Secondary | ICD-10-CM

## 2018-06-20 DIAGNOSIS — Z87891 Personal history of nicotine dependence: Secondary | ICD-10-CM | POA: Diagnosis not present

## 2018-06-20 DIAGNOSIS — F419 Anxiety disorder, unspecified: Secondary | ICD-10-CM | POA: Diagnosis not present

## 2018-06-20 HISTORY — DX: Syncope and collapse: R55

## 2018-06-20 LAB — URINALYSIS, COMPLETE (UACMP) WITH MICROSCOPIC
Bacteria, UA: NONE SEEN
Bilirubin Urine: NEGATIVE
Glucose, UA: NEGATIVE mg/dL
KETONES UR: NEGATIVE mg/dL
Leukocytes, UA: NEGATIVE
NITRITE: NEGATIVE
PROTEIN: 30 mg/dL — AB
Specific Gravity, Urine: 1.02 (ref 1.005–1.030)
pH: 5 (ref 5.0–8.0)

## 2018-06-20 LAB — CBC WITH DIFFERENTIAL/PLATELET
BASOS PCT: 1 %
Basophils Absolute: 0.1 10*3/uL (ref 0–0.1)
Eosinophils Absolute: 0.1 10*3/uL (ref 0–0.7)
Eosinophils Relative: 2 %
HEMATOCRIT: 37.7 % — AB (ref 40.0–52.0)
HEMOGLOBIN: 12.8 g/dL — AB (ref 13.0–18.0)
Lymphocytes Relative: 23 %
Lymphs Abs: 1.1 10*3/uL (ref 1.0–3.6)
MCH: 34.8 pg — ABNORMAL HIGH (ref 26.0–34.0)
MCHC: 34 g/dL (ref 32.0–36.0)
MCV: 102.4 fL — ABNORMAL HIGH (ref 80.0–100.0)
Monocytes Absolute: 0.3 10*3/uL (ref 0.2–1.0)
Monocytes Relative: 6 %
NEUTROS ABS: 3.3 10*3/uL (ref 1.4–6.5)
NEUTROS PCT: 68 %
Platelets: 131 10*3/uL — ABNORMAL LOW (ref 150–440)
RBC: 3.68 MIL/uL — AB (ref 4.40–5.90)
RDW: 14.2 % (ref 11.5–14.5)
WBC: 4.9 10*3/uL (ref 3.8–10.6)

## 2018-06-20 LAB — COMPREHENSIVE METABOLIC PANEL
ALBUMIN: 3.6 g/dL (ref 3.5–5.0)
ALK PHOS: 86 U/L (ref 38–126)
ALT: 20 U/L (ref 0–44)
AST: 29 U/L (ref 15–41)
Anion gap: 9 (ref 5–15)
BILIRUBIN TOTAL: 1.2 mg/dL (ref 0.3–1.2)
BUN: 16 mg/dL (ref 8–23)
CO2: 26 mmol/L (ref 22–32)
Calcium: 9 mg/dL (ref 8.9–10.3)
Chloride: 109 mmol/L (ref 98–111)
Creatinine, Ser: 1.54 mg/dL — ABNORMAL HIGH (ref 0.61–1.24)
GFR calc Af Amer: 47 mL/min — ABNORMAL LOW (ref >60)
GFR calc non Af Amer: 41 mL/min — ABNORMAL LOW (ref >60)
GLUCOSE: 151 mg/dL — AB (ref 70–99)
Potassium: 4.1 mmol/L (ref 3.5–5.1)
Sodium: 144 mmol/L (ref 135–145)
TOTAL PROTEIN: 7.5 g/dL (ref 6.5–8.1)

## 2018-06-20 LAB — TROPONIN I
Troponin I: 0.03 ng/mL (ref <0.03)
Troponin I: 0.05 ng/mL (ref <0.03)

## 2018-06-20 LAB — VALPROIC ACID LEVEL: Valproic Acid Lvl: 11 ug/mL — ABNORMAL LOW (ref 50.0–100.0)

## 2018-06-20 LAB — TSH: TSH: 2.875 u[IU]/mL (ref 0.350–4.500)

## 2018-06-20 MED ORDER — DONEPEZIL HCL 5 MG PO TABS
10.0000 mg | ORAL_TABLET | Freq: Every day | ORAL | Status: DC
  Filled 2018-06-20 (×2): qty 2

## 2018-06-20 MED ORDER — POLYETHYLENE GLYCOL 3350 17 G PO PACK
17.0000 g | PACK | ORAL | Status: DC
  Filled 2018-06-20: qty 1

## 2018-06-20 MED ORDER — ACETAMINOPHEN 325 MG PO TABS: 650.0000 mg | ORAL_TABLET | Freq: Four times a day (QID) | ORAL | Status: DC | PRN

## 2018-06-20 MED ORDER — SODIUM CHLORIDE 0.9% FLUSH: 3.0000 mL | Freq: Two times a day (BID) | INTRAVENOUS | Status: DC

## 2018-06-20 MED ORDER — MUPIROCIN 2 % EX OINT
1.0000 "application " | TOPICAL_OINTMENT | Freq: Two times a day (BID) | CUTANEOUS | Status: DC
  Administered 2018-06-21: 1 via NASAL
  Filled 2018-06-20: qty 22

## 2018-06-20 MED ORDER — CHLORHEXIDINE GLUCONATE CLOTH 2 % EX PADS: 6.0000 | MEDICATED_PAD | Freq: Every day | CUTANEOUS | Status: DC

## 2018-06-20 MED ORDER — MIRTAZAPINE 15 MG PO TABS
7.5000 mg | ORAL_TABLET | Freq: Every day | ORAL | Status: DC
  Administered 2018-06-21: 7.5 mg via ORAL
  Filled 2018-06-20: qty 1

## 2018-06-20 MED ORDER — MEMANTINE HCL ER 28 MG PO CP24
28.0000 mg | ORAL_CAPSULE | Freq: Every day | ORAL | Status: DC
  Administered 2018-06-21: 28 mg via ORAL
  Filled 2018-06-20 (×3): qty 1

## 2018-06-20 MED ORDER — SENNOSIDES-DOCUSATE SODIUM 8.6-50 MG PO TABS: 1.0000 | ORAL_TABLET | Freq: Every evening | ORAL | Status: DC | PRN

## 2018-06-20 MED ORDER — LACTINEX PO CHEW
1.0000 | CHEWABLE_TABLET | Freq: Three times a day (TID) | ORAL | Status: DC
  Filled 2018-06-20 (×3): qty 1

## 2018-06-20 MED ORDER — ONDANSETRON HCL 4 MG PO TABS: 4.0000 mg | ORAL_TABLET | Freq: Four times a day (QID) | ORAL | Status: DC | PRN

## 2018-06-20 MED ORDER — ONDANSETRON HCL 4 MG/2ML IJ SOLN: 4.0000 mg | Freq: Four times a day (QID) | INTRAMUSCULAR | Status: DC | PRN

## 2018-06-20 MED ORDER — ACETAMINOPHEN 650 MG RE SUPP: 650.0000 mg | Freq: Four times a day (QID) | RECTAL | Status: DC | PRN

## 2018-06-20 MED ORDER — TAMSULOSIN HCL 0.4 MG PO CAPS
0.4000 mg | ORAL_CAPSULE | Freq: Every day | ORAL | Status: DC
  Administered 2018-06-21: 0.4 mg via ORAL
  Filled 2018-06-20: qty 1

## 2018-06-20 MED ORDER — DIVALPROEX SODIUM 125 MG PO CSDR
250.0000 mg | DELAYED_RELEASE_CAPSULE | Freq: Two times a day (BID) | ORAL | Status: DC
  Administered 2018-06-21 (×2): 250 mg via ORAL
  Filled 2018-06-20 (×4): qty 2

## 2018-06-20 MED ORDER — SODIUM CHLORIDE 0.9 % IV SOLN
INTRAVENOUS | Status: DC
  Administered 2018-06-20: 19:00:00 via INTRAVENOUS

## 2018-06-20 MED ORDER — SODIUM CHLORIDE 0.9% FLUSH
3.0000 mL | INTRAVENOUS | Status: DC | PRN
  Administered 2018-06-20: 3 mL via INTRAVENOUS
  Filled 2018-06-20: qty 3

## 2018-06-20 MED ORDER — ADULT MULTIVITAMIN W/MINERALS CH
1.0000 | ORAL_TABLET | Freq: Every day | ORAL | Status: DC
  Administered 2018-06-21: 1 via ORAL
  Filled 2018-06-20: qty 1

## 2018-06-20 NOTE — ED Triage Notes (Signed)
Pt to ED via [facility] from home for Altered Mental status. Per EMS family reports that pt was not acting himself. EMS reports that when they arrived pt was laying in the floor. Family started that they placed pt in the floor. Initial BP for EMS was 89/50 laying, when pt sat up BP was 90's systolic. CBG 149. Pt has hx/o dementia. Pt is in NAD currently. A & O x 1

## 2018-06-20 NOTE — ED Notes (Signed)
Dr. Pershing Proud currently at bedside

## 2018-06-20 NOTE — Progress Notes (Signed)
Advanced care plan.  Purpose of the Encounter: CODE STATUS  Parties in Attendance: Patient and family  Patient's Decision Capacity: Okay  Subjective/Patient's story: Presented to emergency room for fall and passing out   Objective/Medical story Has subdural hematoma   Goals of care determination:  Advance care directives and goals of care discussed Patient and family do not want any CPR or intubation or ventilator if the need arises   CODE STATUS: DNR   Time spent discussing advanced care planning: 16 minutes

## 2018-06-20 NOTE — ED Provider Notes (Addendum)
[facility] Emergency Department Provider Note ____________________________________________   First MD Initiated Contact with Patient 06/20/18 1044     (approximate)  I have reviewed the triage vital signs and the nursing notes.   HISTORY  Chief Complaint Altered Mental Status   HPI Keith [name] is a 83 y.o. male with a history of anxiety, atrial fibrillation as well as dementia who was presented to the emergency department altered mental status as well as near syncope per EMS.  Patient was found to have an initial blood pressure in the 80s.  However, resolved in the 120s with subsequent rechecks.  Patient unable to give history secondary to dementia.  Knows his name but unable to give any further history.  EMS reports that he was able to walk without issue to the stretcher.   Past Medical History:  Diagnosis Date  . Anxiety   . Atrial fibrillation (HCC)   . BPH (benign prostatic hyperplasia)   . Dementia   . Subdural hematoma [facility])     Patient Active Problem List   Diagnosis Date Noted  . Cellulitis 06/09/2018  . Dementia with behavioral disturbance   . Slow transit constipation   . Palliative care by specialist   . Advance care planning   . Goals of care, counseling/discussion   . Intracranial bleeding (HCC) 04/22/2018    Past Surgical History:  Procedure Laterality Date  . NO PAST SURGERIES      Prior to Admission medications   Medication Sig Start Date End Date Taking? Authorizing Provider  acetaminophen (TYLENOL) 325 MG tablet Take 2 tablets (650 mg total) by mouth every 6 (six) hours as needed for mild pain (or Fever >/= 101). 06/12/18   Salary, Jetty Duhamel D, MD  ammonium lactate (LAC-HYDRIN) 12 % lotion Apply 1 application topically every evening.     [provider]  clindamycin (CLEOCIN) 300 MG capsule Take 1 capsule (300 mg total) by mouth 3 (three) times daily. 06/12/18   Salary, Evelena Asa, MD  divalproex (DEPAKOTE SPRINKLE)  125 MG capsule Take 250 mg by mouth 2 (two) times daily.    [provider]  donepezil (ARICEPT) 10 MG tablet Take 10 mg by mouth at bedtime.    [provider]  lactobacillus acidophilus & bulgar (LACTINEX) chewable tablet Chew 1 tablet by mouth 3 (three) times daily with meals. 06/12/18   Salary, Jetty Duhamel D, MD  memantine (NAMENDA XR) 28 MG CP24 24 hr capsule Take 28 mg by mouth daily.    [provider]  mirtazapine (REMERON) 7.5 MG tablet Take 7.5 mg by mouth at bedtime.    [provider]  Multiple Vitamin (MULTIVITAMIN) tablet Take 1 tablet by mouth daily.    [provider]  polyethylene glycol (MIRALAX / GLYCOLAX) packet Take 17 g by mouth every other day.     [provider]  tamsulosin (FLOMAX) 0.4 MG CAPS capsule Take 0.4 mg by mouth daily after supper.    [provider]    Allergies Patient has no known allergies.  Family History  Problem Relation Age of Onset  . Hypertension Father     Social History Social History   Tobacco Use  . Smoking status: Former Games developer  . Smokeless tobacco: Never Used  Substance Use Topics  . Alcohol use: Not Currently  . Drug use: Not Currently    Review of Systems  Level 5 caveat secondary to dementia.   ____________________________________________   PHYSICAL EXAM:  VITAL SIGNS: ED  Triage Vitals [06/20/18 1045]  Enc Vitals Group     BP      Pulse      Resp      Temp      Temp src      SpO2      Weight 187 lb 13.3 oz (85.2 kg)     Height      Head Circumference      Peak Flow      Pain Score      Pain Loc      Pain Edu?      Excl. in GC?     Constitutional: Alert and oriented to name only.  In no acute distress. Eyes: Conjunctivae are normal.  Head: Atraumatic. Nose: No congestion/rhinnorhea. Mouth/Throat: Mucous membranes are moist.  Neck: No stridor.   Cardiovascular: Normal rate, regular rhythm. Grossly normal heart sounds.   Respiratory: Normal  respiratory effort.  No retractions. Lungs CTAB. Gastrointestinal: Soft and nontender. No distention. No CVA tenderness. Musculoskeletal: Mild bilateral lower extremity edema without any tenderness palpation, erythema or induration. Neurologic:  Normal speech and language. No gross focal neurologic deficits are appreciated. Skin:  Skin is warm, dry and intact. No rash noted.   ____________________________________________   LABS (all labs ordered are listed, but only abnormal results are displayed)  Labs Reviewed  CBC WITH DIFFERENTIAL/PLATELET - Abnormal; Notable for the following components:      Result Value   RBC 3.68 (*)    Hemoglobin 12.8 (*)    HCT 37.7 (*)    MCV 102.4 (*)    MCH 34.8 (*)    Platelets 131 (*)    All other components within normal limits  COMPREHENSIVE METABOLIC PANEL - Abnormal; Notable for the following components:   Glucose, Bld 151 (*)    Creatinine, Ser 1.54 (*)    GFR calc non Af Amer 41 (*)    GFR calc Af Amer 47 (*)    All other components within normal limits  TROPONIN I - Abnormal; Notable for the following components:   Troponin I 0.03 (*)    All other components within normal limits  URINALYSIS, COMPLETE (UACMP) WITH MICROSCOPIC - Abnormal; Notable for the following components:   Color, Urine YELLOW (*)    APPearance CLEAR (*)    Hgb urine dipstick SMALL (*)    Protein, ur 30 (*)    All other components within normal limits  VALPROIC ACID LEVEL - Abnormal; Notable for the following components:   Valproic Acid Lvl 11 (*)    All other components within normal limits  TROPONIN I - Abnormal; Notable for the following components:   Troponin I 0.05 (*)    All other components within normal limits  TSH   ____________________________________________  EKG  ED ECG REPORT I, Arelia Longest, the attending physician, personally viewed and interpreted this ECG.   Date: 06/20/2018  EKG Time: 1054  Rate: 78  Rhythm: normal sinus rhythm   Axis: Normal  Intervals:none  ST&T Change: No ST segment elevation or depression.  No abnormal T wave inversion.  ____________________________________________  RADIOLOGY  Increased size of bilateral mixed density subdural hematomas.  Discussed the case with Dr. Mosetta Putt and he says that they have approximately doubled since last scan in July. ____________________________________________   PROCEDURES  Procedure(s) performed:   Procedures  Critical Care performed:   ____________________________________________   INITIAL IMPRESSION / ASSESSMENT AND PLAN / ED COURSE  Pertinent labs & imaging results that were available during  my care of the patient were reviewed by me and considered in my medical decision making (see chart for details).  Differential diagnosis includes, but is not limited to, alcohol, illicit or prescription medications, or other toxic ingestion; intracranial pathology such as stroke or intracerebral hemorrhage; fever or infectious causes including sepsis; hypoxemia and/or hypercarbia; uremia; trauma; endocrine related disorders such as diabetes, hypoglycemia, and thyroid-related diseases; hypertensive encephalopathy; etc. As part of my medical decision making, I reviewed the following data within the electronic MEDICAL RECORD NUMBER Notes from prior ED visits  Patient recently admitted to the hospital and then discharged subsequently with an abscess to the right thigh.  ----------------------------------------- 10:55 AM on 06/20/2018 -----------------------------------------  Patient's daughter is also now at the bedside.  Says that the patient had eaten a big breakfast this morning and then had a bowel movement and then had a bath.  Thereafter was standing in the shower and then brushing his teeth when he started to "slumped over."  Patient did have a period of unresponsiveness.  However, daughter says that the patient is now back to his  baseline.  ----------------------------------------- 3:40 PM on 06/20/2018 -----------------------------------------  Discussed the case with Dr. Marcell Barlow of neurosurgery.  Says that the patient is potentially surgical candidate based on the CAT scan but there is contingent on the family's wishes.  I then discussed the case with the daughter who is aware of the worsening subdural hemorrhages.  She has the patient's DNR and hand and says that she does not think that a major surgery would benefit the patient more than it would hurt the patient.  She is most concerned with the patient being comfortable at this time.  She is aware that the patient subdural hemorrhages, if worsened, could result in the patient's death.  However, says the patient has been doing well since being home over the past several days and has been eating and feeding himself.  She says that she would like to discuss the case with hospice.  I discussed the case with Brent Bulla of hospice who would like the patient to contact her on her cell phone.  Patient given contact information for care [facility] of hospice and is discussing plan with patient at this time.  Signed out with Dr. Fanny Bien. ____________________________________________   FINAL CLINICAL IMPRESSION(S) / ED DIAGNOSES  Near syncope.  Bilateral subdural hemorrhages.  NEW MEDICATIONS STARTED DURING THIS VISIT:  New Prescriptions   No medications on file     Note:  This document was prepared using Dragon voice recognition software and may include unintentional dictation errors.     Myrna Blazer, MD 06/20/18 [phone]  Daughter discussed with hospice.  Unable to establish hospice care at this time.  Family says unable to care for patient at this time.  To be admitted for observation and hospice consultation.  Signed out to Dr. Pablo Lawrence.    Myrna Blazer, MD 06/20/18 [phone]

## 2018-06-20 NOTE — H&P (Signed)
[facility] - [place] at [facility]   PATIENT NAME: Keith [name]    MR#:  [id number]  DATE OF BIRTH:  January 03, 1935  DATE OF ADMISSION:  06/20/2018  PRIMARY CARE PHYSICIAN: Patient, No Pcp Per   REQUESTING/REFERRING PHYSICIAN:   CHIEF COMPLAINT:   Chief Complaint  Patient presents with  . Altered Mental Status    HISTORY OF PRESENT ILLNESS: Keith [name]  is a 83 y.o. male with a known history of with history of subdural hematoma, dementia, benign prostate hypertrophy, atrial fibrillation, anxiety disorder was brought to the emergency by family for passing out.  Patient had a shower and that when he came out of the shower he passed out according to family members.  They grabbed him before he could hit the floor.  Patient has chronic subdural hematoma.  He was worked up with CT head which showed a subdural hematoma which is increased in size.  Patient and family do not want any neurosurgical intervention.  Did not want any aggressive measures.  Patient is on hospice services at home.  PAST MEDICAL HISTORY:   Past Medical History:  Diagnosis Date  . Anxiety   . Atrial fibrillation (HCC)   . BPH (benign prostatic hyperplasia)   . Dementia   . Subdural hematoma (HCC)     PAST SURGICAL HISTORY:  Past Surgical History:  Procedure Laterality Date  . NO PAST SURGERIES      SOCIAL HISTORY:  Social History   Tobacco Use  . Smoking status: Former Games developer  . Smokeless tobacco: Never Used  Substance Use Topics  . Alcohol use: Not Currently    FAMILY HISTORY:  Family History  Problem Relation Age of Onset  . Hypertension Father     DRUG ALLERGIES: No Known Allergies  REVIEW OF SYSTEMS:   CONSTITUTIONAL: No fever, fatigue or weakness.  EYES: No blurred or double vision.  EARS, NOSE, AND THROAT: No tinnitus or ear pain.  RESPIRATORY: No cough, shortness of breath, wheezing or hemoptysis.  CARDIOVASCULAR: No chest pain, orthopnea, edema.   GASTROINTESTINAL: No nausea, vomiting, diarrhea or abdominal pain.  GENITOURINARY: No dysuria, hematuria.  ENDOCRINE: No polyuria, nocturia,  HEMATOLOGY: No anemia, easy bruising or bleeding SKIN: No rash or lesion. MUSCULOSKELETAL: No joint pain or arthritis.   NEUROLOGIC: No tingling, numbness, weakness.  PSYCHIATRY: No anxiety or depression.   MEDICATIONS AT HOME:  Prior to Admission medications   Medication Sig Start Date End Date Taking? Authorizing Provider  acetaminophen (TYLENOL) 325 MG tablet Take 2 tablets (650 mg total) by mouth every 6 (six) hours as needed for mild pain (or Fever >/= 101). 06/12/18   Salary, Jetty Duhamel D, MD  ammonium lactate (LAC-HYDRIN) 12 % lotion Apply 1 application topically every evening.     [provider]  clindamycin (CLEOCIN) 300 MG capsule Take 1 capsule (300 mg total) by mouth 3 (three) times daily. 06/12/18   Salary, Evelena Asa, MD  divalproex (DEPAKOTE SPRINKLE) 125 MG capsule Take 250 mg by mouth 2 (two) times daily.    [provider]  donepezil (ARICEPT) 10 MG tablet Take 10 mg by mouth at bedtime.    [provider]  lactobacillus acidophilus & bulgar (LACTINEX) chewable tablet Chew 1 tablet by mouth 3 (three) times daily with meals. 06/12/18   Salary, Jetty Duhamel D, MD  memantine (NAMENDA XR) 28 MG CP24 24 hr capsule Take 28 mg by mouth daily.    [provider]  mirtazapine (REMERON) 7.5 MG tablet Take  7.5 mg by mouth at bedtime.    [provider]  Multiple Vitamin (MULTIVITAMIN) tablet Take 1 tablet by mouth daily.    [provider]  polyethylene glycol (MIRALAX / GLYCOLAX) packet Take 17 g by mouth every other day.     [provider]  tamsulosin (FLOMAX) 0.4 MG CAPS capsule Take 0.4 mg by mouth daily after supper.    [provider]      PHYSICAL EXAMINATION:   VITAL SIGNS: Blood pressure 137/73, pulse (!) 59, temperature 98.5 F (36.9 C), temperature source Oral, resp.  rate 17, weight 85.2 kg, SpO2 100 %.  GENERAL:  83 y.o.-year-old patient lying in the bed with no acute distress.  EYES: Pupils equal, round, reactive to light and accommodation. No scleral icterus. Extraocular muscles intact.  HEENT: Head atraumatic, normocephalic. Oropharynx and nasopharynx clear.  NECK:  Supple, no jugular venous distention. No thyroid enlargement, no tenderness.  LUNGS: Normal breath sounds bilaterally, no wheezing, rales,rhonchi or crepitation. No use of accessory muscles of respiration.  CARDIOVASCULAR: S1, S2 normal. No murmurs, rubs, or gallops.  ABDOMEN: Soft, nontender, nondistended. Bowel sounds present. No organomegaly or mass.  EXTREMITIES: No pedal edema, cyanosis, or clubbing.  NEUROLOGIC: Cranial nerves II through XII are intact. Muscle strength 5/5 in all extremities. Sensation intact. Gait not checked.  PSYCHIATRIC: The patient is alert and oriented x 3.  SKIN: No obvious rash, lesion, or ulcer.   LABORATORY PANEL:   CBC Recent Labs  Lab 06/20/18 1050  WBC 4.9  HGB 12.8*  HCT 37.7*  PLT 131*  MCV 102.4*  MCH 34.8*  MCHC 34.0  RDW 14.2  LYMPHSABS 1.1  MONOABS 0.3  EOSABS 0.1  BASOSABS 0.1   ------------------------------------------------------------------------------------------------------------------  Chemistries  Recent Labs  Lab 06/20/18 1050  NA 144  K 4.1  CL 109  CO2 26  GLUCOSE 151*  BUN 16  CREATININE 1.54*  CALCIUM 9.0  AST 29  ALT 20  ALKPHOS 86  BILITOT 1.2   ------------------------------------------------------------------------------------------------------------------ estimated creatinine clearance is 43.7 mL/min (A) (by C-G formula based on SCr of 1.54 mg/dL (H)). ------------------------------------------------------------------------------------------------------------------ Recent Labs    06/20/18 1050  TSH 2.875     Coagulation profile No results for input(s): INR, PROTIME in the last 168  hours. ------------------------------------------------------------------------------------------------------------------- No results for input(s): DDIMER in the last 72 hours. -------------------------------------------------------------------------------------------------------------------  Cardiac Enzymes Recent Labs  Lab 06/20/18 1050 06/20/18 1335  TROPONINI 0.03* 0.05*   ------------------------------------------------------------------------------------------------------------------ Invalid input(s): POCBNP  ---------------------------------------------------------------------------------------------------------------  Urinalysis    Component Value Date/Time   COLORURINE YELLOW (A) 06/20/2018 1247   APPEARANCEUR CLEAR (A) 06/20/2018 1247   LABSPEC 1.020 06/20/2018 1247   PHURINE 5.0 06/20/2018 1247   GLUCOSEU NEGATIVE 06/20/2018 1247   HGBUR SMALL (A) 06/20/2018 1247   BILIRUBINUR NEGATIVE 06/20/2018 1247   KETONESUR NEGATIVE 06/20/2018 1247   PROTEINUR 30 (A) 06/20/2018 1247   NITRITE NEGATIVE 06/20/2018 1247   LEUKOCYTESUR NEGATIVE 06/20/2018 1247     RADIOLOGY: Ct Head Wo Contrast  Result Date: 06/20/2018 CLINICAL DATA:  Altered mental status. EXAM: CT HEAD WITHOUT CONTRAST TECHNIQUE: Contiguous axial images were obtained from the base of the skull through the vertex without intravenous contrast. COMPARISON:  04/22/2018 FINDINGS: Brain: Mixed density subdural hematomas over the cerebral convexities bilaterally have enlarged in size now measuring up to 2.6 cm in thickness on the right and 1.2 cm on the left. Associated mass effect is most notable on the right frontal lobe. The lateral and third ventricles are slightly compressed  compared to the prior CT. There is no significant midline shift. No acute large territory infarct is identified. Small volume intraventricular hemorrhage in the occipital horns on the prior study is no longer seen. Patchy cerebral white matter  hypodensities are unchanged and nonspecific but compatible with moderate chronic small vessel ischemic disease. Vascular: Calcified atherosclerosis at the skull base. No hyperdense vessel. Skull: No fracture or focal osseous lesion. Sinuses/Orbits: Moderate right and mild left ethmoid air cell mucosal thickening. Clear mastoid air cells. Unremarkable included orbits. Other: Chronic mild asymmetric enlargement of the right temporalis muscle. IMPRESSION: 1. Increased size of bilateral mixed density subdural hematomas, now 2.6 cm in thickness on the right. No midline shift. 2. Resolved intraventricular hemorrhage. 3. Moderate chronic small vessel ischemic disease. Electronically Signed   By: Sebastian Ache M.D.   On: 06/20/2018 12:11    EKG: Orders placed or performed during the hospital encounter of 06/20/18  . ED EKG  . ED EKG  . EKG 12-Lead  . EKG 12-Lead    IMPRESSION AND PLAN:  83 year old male patient with history of subdural hematoma, atrial fibrillation, benign prostate hyperplasia, dementia presented to the emergency room for passing out  -Syncope Admit patient to medical floor Cardiac monitoring Patient and family do not want any aggressive intervention IV fluids  -Subdural hematoma Acute on chronic No acute intervention as per family Do not want any neurosurgery or aggressive intervention Supportive care Hospice services at home  -Dementia Supportive care  -DVT prophylaxis Sequential compression device to lower extremities  All the records are reviewed and case discussed with ED provider. Management plans discussed with the patient, family and they are in agreement.  CODE STATUS:DNR Code Status History    Date Active Date Inactive Code Status Order ID Comments User Context   06/09/2018 1833 06/12/2018 1941 DNR [id number]  Alford Highland, MD ED   04/22/2018 2112 04/25/2018 2215 DNR [id number]  Shaune Pollack, MD Inpatient    Questions for Most Recent Historical Code Status  (Order [id number])    Question Answer Comment   In the event of cardiac or respiratory ARREST Do not call a "code blue"    In the event of cardiac or respiratory ARREST Do not perform Intubation, CPR, defibrillation or ACLS    In the event of cardiac or respiratory ARREST Use medication by any route, position, wound care, and other measures to relive pain and suffering. May use oxygen, suction and manual treatment of airway obstruction as needed for comfort.    Comments nurse may pronounce         Advance Directive Documentation     Most Recent Value  Type of Advance Directive  Out of facility DNR (pink MOST or yellow form)  Pre-existing out of facility DNR order (yellow form or pink MOST form)  Yellow form placed in chart (order not valid for inpatient use)  "MOST" Form in Place?  -       TOTAL TIME TAKING CARE OF THIS PATIENT: 52 minutes.    Ihor Austin M.D on 06/20/2018 at 5:06 PM  Between 7am to 6pm - Pager - [phone]  After 6pm go to [email or web address] - password [email or web address]  [facility]  Office  [phone]  CC: Primary care physician; Patient, No Pcp Per

## 2018-06-20 NOTE — ED Notes (Signed)
Trop sent to lab

## 2018-06-20 NOTE — ED Notes (Addendum)
Pt daughter at bedside, dtr states that after pt got out of the shower he became very lethargic and "slumped' over. Pt dtr states that pt was not responding to her so she called 911.

## 2018-06-21 LAB — BASIC METABOLIC PANEL
ANION GAP: 7 (ref 5–15)
BUN: 16 mg/dL (ref 8–23)
CHLORIDE: 112 mmol/L — AB (ref 98–111)
CO2: 26 mmol/L (ref 22–32)
Calcium: 8.6 mg/dL — ABNORMAL LOW (ref 8.9–10.3)
Creatinine, Ser: 1.32 mg/dL — ABNORMAL HIGH (ref 0.61–1.24)
GFR, EST AFRICAN AMERICAN: 57 mL/min — AB (ref >60)
GFR, EST NON AFRICAN AMERICAN: 49 mL/min — AB (ref >60)
Glucose, Bld: 87 mg/dL (ref 70–99)
Potassium: 4.7 mmol/L (ref 3.5–5.1)
SODIUM: 145 mmol/L (ref 135–145)

## 2018-06-21 LAB — CBC
HCT: 34.9 % — ABNORMAL LOW (ref 40.0–52.0)
Hemoglobin: 11.9 g/dL — ABNORMAL LOW (ref 13.0–18.0)
MCH: 34.7 pg — ABNORMAL HIGH (ref 26.0–34.0)
MCHC: 34.1 g/dL (ref 32.0–36.0)
MCV: 101.7 fL — AB (ref 80.0–100.0)
Platelets: 112 10*3/uL — ABNORMAL LOW (ref 150–440)
RBC: 3.44 MIL/uL — AB (ref 4.40–5.90)
RDW: 13.8 % (ref 11.5–14.5)
WBC: 5 10*3/uL (ref 3.8–10.6)

## 2018-06-21 MED ORDER — FLORANEX PO PACK
1.0000 g | PACK | Freq: Three times a day (TID) | ORAL | Status: DC
  Administered 2018-06-21 (×2): 1 g via ORAL
  Filled 2018-06-21 (×4): qty 1

## 2018-06-21 NOTE — Discharge Instructions (Signed)
Fall and aspiration precaution. °

## 2018-06-21 NOTE — Discharge Summary (Signed)
[facility] [place] [facility]   PATIENT NAME: Keith [name]    MR#:  [id number]  DATE OF BIRTH:  1935/05/01  DATE OF ADMISSION:  06/20/2018   ADMITTING PHYSICIAN: Ihor Austin, MD  DATE OF DISCHARGE: 06/21/2018 PRIMARY CARE PHYSICIAN: Patient, No Pcp Per   ADMISSION DIAGNOSIS:  Subdural hematoma (HCC) [S06.5X9A] Near syncope [R55] DISCHARGE DIAGNOSIS:  Active Problems:   Syncope and collapse  SECONDARY DIAGNOSIS:   Past Medical History:  Diagnosis Date  . Anxiety   . Atrial fibrillation (HCC)   . BPH (benign prostatic hyperplasia)   . Dementia   . Subdural hematoma [facility])    HOSPITAL COURSE:  83 year old male patient with history of subdural hematoma, atrial fibrillation, benign prostate hyperplasia, dementia presented to the emergency room for passing out  -Syncope, unclear etiology, possible due to dehydration. 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   -Dementia Supportive care.  Patient has private caregiver, PT, OT at home.  -DVT prophylaxis Sequential compression device to lower extremities. DISCHARGE CONDITIONS:  Stable but poor prognosis, discharge to home with hospice care. CONSULTS OBTAINED:   DRUG ALLERGIES:  No Known Allergies DISCHARGE MEDICATIONS:   Allergies as of 06/21/2018   No Known Allergies     Medication List    STOP taking these medications   clindamycin 300 MG capsule Commonly known as:  CLEOCIN     TAKE these medications   acetaminophen 325 MG tablet Commonly known as:  TYLENOL Take 2 tablets (650 mg total) by mouth every 6 (six) hours as needed for mild pain (or Fever >/= 101).   ammonium lactate 12 % lotion Commonly known as:  LAC-HYDRIN Apply 1 application topically every evening.   divalproex 125 MG  capsule Commonly known as:  DEPAKOTE SPRINKLE Take 250 mg by mouth 2 (two) times daily.   donepezil 10 MG tablet Commonly known as:  ARICEPT Take 10 mg by mouth at bedtime.   lactobacillus acidophilus & bulgar chewable tablet Chew 1 tablet by mouth 3 (three) times daily with meals.   mirtazapine 7.5 MG tablet Commonly known as:  REMERON Take 7.5 mg by mouth at bedtime.   multivitamin tablet Take 1 tablet by mouth daily.   NAMENDA XR 28 MG Cp24 24 hr capsule Generic drug:  memantine Take 28 mg by mouth daily.   polyethylene glycol packet Commonly known as:  MIRALAX / GLYCOLAX Take 17 g by mouth every other day. Notes to patient:  HE HAD A DOSE THIS MORNING   tamsulosin 0.4 MG Caps capsule Commonly known as:  FLOMAX Take 0.4 mg by mouth daily after supper.        DISCHARGE INSTRUCTIONS:  See AVS.  If you experience worsening of your admission symptoms, develop shortness of breath, life threatening emergency, suicidal or homicidal thoughts you must seek medical attention immediately by calling 911 or calling your MD immediately  if symptoms less severe.  You Must read complete instructions/literature along with all the possible adverse reactions/side effects for all the Medicines you take and that have been prescribed to you. Take any new Medicines after you have completely understood and accpet all the possible adverse reactions/side effects.   Please note  You were cared for by a hospitalist during your hospital stay. If you have any questions about your discharge medications or  the care you received while you were in the hospital after you are discharged, you can call the unit and asked to speak with the hospitalist on call if the hospitalist that took care of you is not available. Once you are discharged, your primary care physician will handle any further medical issues. Please note that NO REFILLS for any discharge medications will be authorized once you are discharged,  as it is imperative that you return to your primary care physician (or establish a relationship with a primary care physician if you do not have one) for your aftercare needs so that they can reassess your need for medications and monitor your lab values.    On the day of Discharge:  VITAL SIGNS:  Blood pressure (!) 169/80, pulse 65, temperature 98.1 F (36.7 C), temperature source Oral, resp. rate 18, height 5\' 5"  (1.651 m), weight 83.7 kg, SpO2 100 %. PHYSICAL EXAMINATION:  GENERAL:  83 y.o.-year-old patient lying in the bed with no acute distress.  EYES: No scleral icterus. Extraocular muscles intact.  HEENT: Head atraumatic, normocephalic.   NECK:  Supple, no jugular venous distention. No thyroid enlargement, no tenderness.  LUNGS: Normal breath sounds bilaterally, no wheezing, rales,rhonchi or crepitation. No use of accessory muscles of respiration.  CARDIOVASCULAR: S1, S2 normal. No murmurs, rubs, or gallops.  ABDOMEN: Soft, non-tender, non-distended. Bowel sounds present. No organomegaly or mass.  EXTREMITIES: No pedal edema, cyanosis, or clubbing.  NEUROLOGIC: Unable to exam. PSYCHIATRIC: The patient is demented. SKIN: No obvious rash, lesion, or ulcer.  DATA REVIEW:   CBC Recent Labs  Lab 06/21/18 0621  WBC 5.0  HGB 11.9*  HCT 34.9*  PLT 112*    Chemistries  Recent Labs  Lab 06/20/18 1050 06/21/18 0621  NA 144 145  K 4.1 4.7  CL 109 112*  CO2 26 26  GLUCOSE 151* 87  BUN 16 16  CREATININE 1.54* 1.32*  CALCIUM 9.0 8.6*  AST 29  --   ALT 20  --   ALKPHOS 86  --   BILITOT 1.2  --      Microbiology Results  Results for orders placed or performed during the hospital encounter of 06/09/18  Wound or Superficial Culture     Status: None   Collection Time: 06/09/18  5:10 PM  Result Value Ref Range Status   Specimen Description   Final    THIGH Performed at [facility], [address], [place], [place] [address]    Special Requests   Final     NONE Performed at [facility], [address], [place], [place] [address]    Gram Stain   Final    MODERATE WBC PRESENT,BOTH PMN AND MONONUCLEAR FEW GRAM POSITIVE COCCI Performed at [facility], [address], [place], [place] [address]    Culture FEW METHICILLIN RESISTANT STAPHYLOCOCCUS AUREUS  Final   Report Status 06/12/2018 FINAL  Final   Organism ID, Bacteria METHICILLIN RESISTANT STAPHYLOCOCCUS AUREUS  Final      Susceptibility   Methicillin resistant staphylococcus aureus - MIC*    CIPROFLOXACIN >=8 RESISTANT Resistant     ERYTHROMYCIN >=8 RESISTANT Resistant     GENTAMICIN <=0.5 SENSITIVE Sensitive     OXACILLIN >=4 RESISTANT Resistant     TETRACYCLINE <=1 SENSITIVE Sensitive     VANCOMYCIN <=0.5 SENSITIVE Sensitive     TRIMETH/SULFA <=10 SENSITIVE Sensitive     CLINDAMYCIN <=0.25 SENSITIVE Sensitive     RIFAMPIN <=0.5 SENSITIVE Sensitive  Inducible Clindamycin NEGATIVE Sensitive     * FEW METHICILLIN RESISTANT STAPHYLOCOCCUS AUREUS  MRSA PCR Screening     Status: Abnormal   Collection Time: 06/09/18  8:03 PM  Result Value Ref Range Status   MRSA by PCR POSITIVE (A) NEGATIVE Final    Comment:        The GeneXpert MRSA Assay (FDA approved for NASAL specimens only), is one component of a comprehensive MRSA colonization surveillance program. It is not intended to diagnose MRSA infection nor to guide or monitor treatment for MRSA infections. RESULT CALLED TO, READ BACK BY AND VERIFIED WITH: C/VIVIAN ALI @2137  06/09/18 [facility] Performed at [facility], [address], [place], [place] [address]     RADIOLOGY:  No results found.   Management plans discussed with the patient, family and they are in agreement.  CODE STATUS: DNR   TOTAL TIME TAKING CARE OF THIS PATIENT: 28 minutes.    Shaune Pollack M.D on 06/21/2018 at 1:43 PM  Between 7am to 6pm - Pager - [phone]  After 6pm go to [email or web address] - Social research officer, government  [facility] [place] [facility]  Office  [phone]  CC: Primary care physician; Patient, No Pcp Per   Note: This dictation was prepared with Dragon dictation along with smaller phrase technology. Any transcriptional errors that result from this process are unintentional.

## 2018-06-21 NOTE — Progress Notes (Signed)
Discharged to home via EMS 

## 2018-07-02 ENCOUNTER — Ambulatory Visit (INDEPENDENT_AMBULATORY_CARE_PROVIDER_SITE_OTHER): Payer: Self-pay | Admitting: Nurse Practitioner

## 2018-07-02 ENCOUNTER — Other Ambulatory Visit: Payer: Self-pay

## 2018-07-02 ENCOUNTER — Encounter: Payer: Self-pay | Admitting: Nurse Practitioner

## 2018-07-02 VITALS — BP 140/80 | HR 69 | Ht 65.0 in | Wt 184.5 lb

## 2018-07-02 DIAGNOSIS — B9562 Methicillin resistant Staphylococcus aureus infection as the cause of diseases classified elsewhere: Secondary | ICD-10-CM | POA: Insufficient documentation

## 2018-07-02 DIAGNOSIS — A4902 Methicillin resistant Staphylococcus aureus infection, unspecified site: Secondary | ICD-10-CM

## 2018-07-02 DIAGNOSIS — N400 Enlarged prostate without lower urinary tract symptoms: Secondary | ICD-10-CM | POA: Insufficient documentation

## 2018-07-02 DIAGNOSIS — I872 Venous insufficiency (chronic) (peripheral): Secondary | ICD-10-CM

## 2018-07-02 DIAGNOSIS — F0391 Unspecified dementia with behavioral disturbance: Secondary | ICD-10-CM

## 2018-07-02 DIAGNOSIS — Z7689 Persons encountering health services in other specified circumstances: Secondary | ICD-10-CM

## 2018-07-02 DIAGNOSIS — L089 Local infection of the skin and subcutaneous tissue, unspecified: Secondary | ICD-10-CM

## 2018-07-02 DIAGNOSIS — I2510 Atherosclerotic heart disease of native coronary artery without angina pectoris: Secondary | ICD-10-CM | POA: Insufficient documentation

## 2018-07-02 DIAGNOSIS — R142 Eructation: Secondary | ICD-10-CM

## 2018-07-02 MED ORDER — AMMONIUM LACTATE 12 % EX LOTN: 1.0000 "application " | TOPICAL_LOTION | Freq: Every evening | CUTANEOUS | 5 refills | Status: DC

## 2018-07-02 MED ORDER — OMEPRAZOLE 20 MG PO CPDR: 20.0000 mg | DELAYED_RELEASE_CAPSULE | Freq: Every day | ORAL | 3 refills | Status: AC

## 2018-07-02 NOTE — Patient Instructions (Addendum)
Keith [name],   Thank you for coming in to clinic today.  1. He may be swallowing air.  If not, this is reflux.  2. For dementia drugs,  Consider stopping these.  It is likely to make progression happen faster, but by keeping these on you may prolong the inevitable.   Please schedule a follow-up appointment with [name], AGNP. Return in about 3 months (around 10/02/2018) for dementia AND as needed.  If you have any other questions or concerns, please feel free to call the clinic or send a message through MyChart. You may also schedule an earlier appointment if necessary.  You will receive a survey after today's visit either digitally by e-mail or paper by [employer]. Your experiences and feedback matter to [place].  Please respond so we know how we are doing as we provide care for you.   [name], DNP, AGNP-BC Adult Gerontology Nurse Practitioner [facility]

## 2018-07-02 NOTE — Progress Notes (Signed)
Subjective:    Patient ID: Keith [name], male    DOB: 08-18-1935, 83 y.o.   MRN: [id number]  Keith [name] is a 83 y.o. male presenting on 07/02/2018 for Establish Care (hospice care, MRSA on his R leg ) and Gastroesophageal Reflux (belching x 2-3 mths, worsening)   HPI Establish Care New Provider Pt last seen by PCP in [place] several months to 1 year ago.  Obtain records.  Other recent records available from [facility] visits.  Patient is existing patient of [facility] for dementia progression, subdural hematoma. Patient has already established DNR status.  Daughter reinforces this today. - Daughter has questions about some medications that patient had been taking prior to hospitalization at [facility]. Patient was on amlodipine, atorvastatin, hydralazine, lorazepam 1 tab po q 6 hr prn agitation, Xarelto, Tramadol (for agitation associated with pain not able to communicate).  Dementia Fall occurred April 22, 2018 Left brow: laceration.  Also had bed bugs. Repeat visit to [facility] on 06/20/2018 for near syncope after standing - poor balance, poor responsiveness/drooling.  This occurred after bathing.  Since returning home with Hospice care, has been stable and no repeat incidents per patient's daughter and CNA caregiver. - No current wandering.  Had problems with this in past, but patient no longer seems to have anywhere to go per caregivers.   - No usual nighttime awakenings, but did have one at 1am last night. - Now has 24 hour CNA for care (private) - Misty Stanley is daytime, another CNA helps at night.  - Patient unable to bathe, dress, or toilet. - He can feed, but only with cues.  Needs setup for every meal.  Has gained weight since moving to [place].  - Patient was previously very active, walking 5 miles per day.  He is no longer able to do this 2/2 dementia.   Belching Regular belching.  Patient and caregiver are concerned and do not know why this may be happening.  Patient has not had any  apparent abdominal pain, heartburn, reflux, coughing/choking, or vomiting.  IS not currently taking any antacids.  Constipation Takes Miralax prn with good relief.  Last took a dose on Sat after no BM x 2 days, having daily BM since.  Bowel and urinary incontinence - wears briefs. Skin is reported to be intact in perineal region without breakdown.  Past Medical History:  Diagnosis Date  . Anxiety   . Atrial fibrillation (HCC)   . BPH (benign prostatic hyperplasia)   . Dementia (HCC)   . Heart disease   . MRSA infection   . Subdural hematoma (HCC)   . Syncope and collapse 06/20/2018   Past Surgical History:  Procedure Laterality Date  . CORONARY ARTERY BYPASS GRAFT    . HERNIA REPAIR     Social History   Socioeconomic History  . Marital status: Divorced    Spouse name: Not on file  . Number of children: Not on file  . Years of education: Not on file  . Highest education level: High school graduate  Occupational History  . Not on file  Social Needs  . Financial resource strain: Not on file  . Food insecurity:    Worry: Not on file    Inability: Not on file  . Transportation needs:    Medical: Not on file    Non-medical: Not on file  Tobacco Use  . Smoking status: Former Games developer  . Smokeless tobacco: Never Used  Substance and Sexual Activity  .  Alcohol use: Never    Frequency: Never  . Drug use: Never  . Sexual activity: Not Currently  Lifestyle  . Physical activity:    Days per week: Not on file    Minutes per session: Not on file  . Stress: Not on file  Relationships  . Social connections:    Talks on phone: Not on file    Gets together: Not on file    Attends religious service: Not on file    Active member of club or organization: Not on file    Attends meetings of clubs or organizations: Not on file    Relationship status: Not on file  . Intimate partner violence:    Fear of current or ex partner: Not on file    Emotionally abused: Not on file     Physically abused: Not on file    Forced sexual activity: Not on file  Other Topics Concern  . Not on file  Social History Narrative  . Not on file   Family History  Problem Relation Age of Onset  . Hypertension Father    Current Outpatient Medications on File Prior to Visit  Medication Sig  . acetaminophen (TYLENOL) 325 MG tablet Take 2 tablets (650 mg total) by mouth every 6 (six) hours as needed for mild pain (or Fever >/= 101).  Marland Kitchen divalproex (DEPAKOTE SPRINKLE) 125 MG capsule Take 250 mg by mouth 2 (two) times daily.  Marland Kitchen donepezil (ARICEPT) 10 MG tablet Take 10 mg by mouth at bedtime.  . memantine (NAMENDA XR) 28 MG CP24 24 hr capsule Take 28 mg by mouth daily.  . mirtazapine (REMERON) 7.5 MG tablet Take 7.5 mg by mouth at bedtime.  . Multiple Vitamin (MULTIVITAMIN) tablet Take 1 tablet by mouth daily.  . polyethylene glycol (MIRALAX / GLYCOLAX) packet Take 17 g by mouth every other day.   . tamsulosin (FLOMAX) 0.4 MG CAPS capsule Take 0.4 mg by mouth daily after supper.  . clindamycin (CLEOCIN) 300 MG capsule   . lactobacillus acidophilus & bulgar (LACTINEX) chewable tablet Chew 1 tablet by mouth 3 (three) times daily with meals. (Patient not taking: Reported on 07/02/2018)   No current facility-administered medications on file prior to visit.     Review of Systems  Constitutional: Negative for activity change, appetite change, fatigue and unexpected weight change.  HENT: Negative for congestion, hearing loss and trouble swallowing.   Eyes: Negative for visual disturbance.  Respiratory: Negative for choking, shortness of breath and wheezing.   Gastrointestinal: Positive for constipation. Negative for abdominal pain, blood in stool and diarrhea.  Genitourinary: Positive for difficulty urinating (incontinence). Negative for discharge, genital sores, penile swelling and scrotal swelling.  Skin: Positive for wound (left lateral thigh, dressing change every 3 days).  Neurological:  Positive for speech difficulty (poor communication ability) and weakness. Negative for seizures.  Psychiatric/Behavioral: Positive for behavioral problems (intermittently, improved at home in familiar surroundings), decreased concentration and sleep disturbance.   Per HPI unless specifically indicated above    Objective:    BP 140/80 (BP Location: Left Arm, Patient Position: Standing, Cuff Size: Normal) Comment (Cuff Size): manual  Pulse 69   Ht 5\' 5"  (1.651 m)   Wt 184 lb 8.4 oz (83.7 kg) Comment: unable to stand on the scale because of dementia. Weight reported from 1 week ago by Daughter.  BMI 30.71 kg/m   Wt Readings from Last 3 Encounters:  07/02/18 184 lb 8.4 oz (83.7 kg)  06/20/18 184 lb 8.4  oz (83.7 kg)  06/09/18 188 lb (85.3 kg)    Physical Exam  Constitutional: He is oriented to person, place, and time. He appears well-developed and well-nourished. No distress.  HENT:  Head: Normocephalic and atraumatic.  Eyes: Pupils are equal, round, and reactive to light. Conjunctivae and EOM are normal.  Cardiovascular: Normal rate, regular rhythm, S1 normal, S2 normal, normal heart sounds and intact distal pulses. Exam reveals decreased pulses.  Pulses:      Radial pulses are 2+ on the right side, and 2+ on the left side.       Posterior tibial pulses are 0 on the right side, and 0 on the left side.  Pulmonary/Chest: Effort normal and breath sounds normal. No respiratory distress.  Abdominal: Soft. Bowel sounds are normal. He exhibits no distension.  Neurological: He is alert and oriented to person, place, and time.  Skin: Skin is warm and dry. Capillary refill takes less than 2 seconds.     Psychiatric: He has a normal mood and affect. He is hyperactive (constantly fidgeting with hands,  regular mouth/tongue movements, seems to be swallowing air followed by belching) and withdrawn. Cognition and memory are impaired. He expresses inappropriate judgment. He does not express impulsivity.  He is noncommunicative (occasionally communicates needs/desires, but largely noncommunicative). He exhibits abnormal recent memory and abnormal remote memory. He is inattentive.  Vitals reviewed.  Results for orders placed or performed during the hospital encounter of 06/20/18  CBC with Differential  Result Value Ref Range   WBC 4.9 3.8 - 10.6 K/uL   RBC 3.68 (L) 4.40 - 5.90 MIL/uL   Hemoglobin 12.8 (L) 13.0 - 18.0 g/dL   HCT 16.1 (L) 09.6 - 04.5 %   MCV 102.4 (H) 80.0 - 100.0 fL   MCH 34.8 (H) 26.0 - 34.0 pg   MCHC 34.0 32.0 - 36.0 g/dL   RDW 40.9 81.1 - 91.4 %   Platelets 131 (L) 150 - 440 K/uL   Neutrophils Relative % 68 %   Neutro Abs 3.3 1.4 - 6.5 K/uL   Lymphocytes Relative 23 %   Lymphs Abs 1.1 1.0 - 3.6 K/uL   Monocytes Relative 6 %   Monocytes Absolute 0.3 0.2 - 1.0 K/uL   Eosinophils Relative 2 %   Eosinophils Absolute 0.1 0 - 0.7 K/uL   Basophils Relative 1 %   Basophils Absolute 0.1 0 - 0.1 K/uL  Comprehensive metabolic panel  Result Value Ref Range   Sodium 144 135 - 145 mmol/L   Potassium 4.1 3.5 - 5.1 mmol/L   Chloride 109 98 - 111 mmol/L   CO2 26 22 - 32 mmol/L   Glucose, Bld 151 (H) 70 - 99 mg/dL   BUN 16 8 - 23 mg/dL   Creatinine, Ser 7.82 (H) 0.61 - 1.24 mg/dL   Calcium 9.0 8.9 - 95.6 mg/dL   Total Protein 7.5 6.5 - 8.1 g/dL   Albumin 3.6 3.5 - 5.0 g/dL   AST 29 15 - 41 U/L   ALT 20 0 - 44 U/L   Alkaline Phosphatase 86 38 - 126 U/L   Total Bilirubin 1.2 0.3 - 1.2 mg/dL   GFR calc non Af Amer 41 (L) >60 mL/min   GFR calc Af Amer 47 (L) >60 mL/min   Anion gap 9 5 - 15  Troponin I  Result Value Ref Range   Troponin I 0.03 (HH) <0.03 ng/mL  TSH  Result Value Ref Range   TSH 2.875 0.350 - 4.500 uIU/mL  Urinalysis, Complete w Microscopic  Result Value Ref Range   Color, Urine YELLOW (A) YELLOW   APPearance CLEAR (A) CLEAR   Specific Gravity, Urine 1.020 1.005 - 1.030   pH 5.0 5.0 - 8.0   Glucose, UA NEGATIVE NEGATIVE mg/dL   Hgb urine dipstick  SMALL (A) NEGATIVE   Bilirubin Urine NEGATIVE NEGATIVE   Ketones, ur NEGATIVE NEGATIVE mg/dL   Protein, ur 30 (A) NEGATIVE mg/dL   Nitrite NEGATIVE NEGATIVE   Leukocytes, UA NEGATIVE NEGATIVE   RBC / HPF 6-10 0 - 5 RBC/hpf   WBC, UA 0-5 0 - 5 WBC/hpf   Bacteria, UA NONE SEEN NONE SEEN   Squamous Epithelial / LPF 0-5 0 - 5   Mucus PRESENT   Valproic acid level  Result Value Ref Range   Valproic Acid Lvl 11 (L) 50.0 - 100.0 ug/mL  Troponin I  Result Value Ref Range   Troponin I 0.05 (HH) <0.03 ng/mL  Basic metabolic panel  Result Value Ref Range   Sodium 145 135 - 145 mmol/L   Potassium 4.7 3.5 - 5.1 mmol/L   Chloride 112 (H) 98 - 111 mmol/L   CO2 26 22 - 32 mmol/L   Glucose, Bld 87 70 - 99 mg/dL   BUN 16 8 - 23 mg/dL   Creatinine, Ser 9.60 (H) 0.61 - 1.24 mg/dL   Calcium 8.6 (L) 8.9 - 10.3 mg/dL   GFR calc non Af Amer 49 (L) >60 mL/min   GFR calc Af Amer 57 (L) >60 mL/min   Anion gap 7 5 - 15  CBC  Result Value Ref Range   WBC 5.0 3.8 - 10.6 K/uL   RBC 3.44 (L) 4.40 - 5.90 MIL/uL   Hemoglobin 11.9 (L) 13.0 - 18.0 g/dL   HCT 45.4 (L) 09.8 - 11.9 %   MCV 101.7 (H) 80.0 - 100.0 fL   MCH 34.7 (H) 26.0 - 34.0 pg   MCHC 34.1 32.0 - 36.0 g/dL   RDW 14.7 82.9 - 56.2 %   Platelets 112 (L) 150 - 440 K/uL   CT Head Wo Contrast CLINICAL DATA:  Altered mental status.  EXAM: CT HEAD WITHOUT CONTRAST  TECHNIQUE: Contiguous axial images were obtained from the base of the skull through the vertex without intravenous contrast.  COMPARISON:  04/22/2018  FINDINGS: Brain: Mixed density subdural hematomas over the cerebral convexities bilaterally have enlarged in size now measuring up to 2.6 cm in thickness on the right and 1.2 cm on the left. Associated mass effect is most notable on the right frontal lobe. The lateral and third ventricles are slightly compressed compared to the prior CT. There is no significant midline shift. No acute large territory infarct is identified.  Small volume intraventricular hemorrhage in the occipital horns on the prior study is no longer seen. Patchy cerebral white matter hypodensities are unchanged and nonspecific but compatible with moderate chronic small vessel ischemic disease.  Vascular: Calcified atherosclerosis at the skull base. No hyperdense vessel.  Skull: No fracture or focal osseous lesion.  Sinuses/Orbits: Moderate right and mild left ethmoid air cell mucosal thickening. Clear mastoid air cells. Unremarkable included orbits.  Other: Chronic mild asymmetric enlargement of the right temporalis muscle.  IMPRESSION: 1. Increased size of bilateral mixed density subdural hematomas, now 2.6 cm in thickness on the right. No midline shift. 2. Resolved intraventricular hemorrhage. 3. Moderate chronic small vessel ischemic disease.  Electronically Signed   By: Sebastian Ache M.D.   On: 06/20/2018 12:11  Assessment & Plan:   Problem List Items Addressed This Visit      Cardiovascular and Mediastinum   Peripheral vascular disease with stasis dermatitis Improvement with Lac-hydrin lotion application nightly.  Patient continues having darkening of skin consistent with venous stasis.  No appreciable edema on exam.   - Continue Lac-Hydrin - Followup prn. Monitor for wounds on legs/feet.   Relevant Medications   ammonium lactate (LAC-HYDRIN) 12 % lotion     Nervous and Auditory   Dementia with behavioral disturbance (HCC) - Primary Severe dementia with loss of function.  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  Patient with loss of ADL functional ability for most ADLs.  Is regaining ability to feed himself with cues and good caregivers. - Goals of Care discussed today.  Patient's daughter in position to make no aggressive treatments. - Discussed timing of stopping Aricept and Namenda.  No decision made today to stop, but will consider.  Discussed nature  of these meds to prevent progression of disease.  Without them, progression of natural course of disease could become more rapid. - Continue Hospice with primary management by medical staff with hospice, home care.  Would qualify for memory care unit if needed in future.   - Followup 3 months or sooner if needed.     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  No image to compare prior appearance of wound.  Perhaps, it is reducing in size.  Patient is still on clindamycin per report directed by Hospice.  Wound care by Hospice staff. - Continue antibiotics and wound care until completely closed.  If worsening wound appearance, may need to repeat culture for susceptibility. - Followup prn.  May need to consider wound care.   Relevant Medications   clindamycin (CLEOCIN) 300 MG capsule    Other Visit Diagnoses    Encounter to establish care     Previous PCP was in [place].  Records will be requested.  Past medical, family, and surgical history reviewed w/ family in clinic today. Patient largely non-communicative.      Belching     Likely related to tics and repetitive swallowing behavior exhibited by patient during exam today.  Cannot fully exclude GERD as patient is not able to share symptoms.  - START omeprazole 20 mg once daily po prior to breakfast. - Monitor for change in symptoms.  If no improvement, will stop omeprazole. - Followup prn.   Relevant Medications   omeprazole (PRILOSEC) 20 MG capsule      Meds ordered this encounter  Medications  . omeprazole (PRILOSEC) 20 MG capsule    Sig: Take 1 capsule (20 mg total) by mouth daily.    Dispense:  30 capsule    Refill:  3    Order Specific Question:   Supervising Provider    Answer:   Smitty Cords [[id number]]  . ammonium lactate (LAC-HYDRIN) 12 % lotion    Sig: Apply 1 application topically every evening.    Dispense:   400 g    Refill:  5    Order Specific Question:   Supervising Provider    Answer:   Smitty Cords [[id number]]    Follow up plan: Return in about 3 months (around 10/02/2018) for dementia AND as needed.  [name], DNP, AGPCNP-BC Adult Gerontology Primary Care Nurse Practitioner [facility] 07/02/2018, 2:07 PM

## 2018-08-14 ENCOUNTER — Emergency Department

## 2018-08-14 ENCOUNTER — Other Ambulatory Visit: Payer: Self-pay

## 2018-08-14 ENCOUNTER — Encounter: Payer: Self-pay | Admitting: Emergency Medicine

## 2018-08-14 ENCOUNTER — Emergency Department
Admission: EM | Admit: 2018-08-14 | Discharge: 2018-08-14 | Disposition: A | Discharge status: Home / Self Care | Attending: Emergency Medicine | Admitting: Emergency Medicine

## 2018-08-14 DIAGNOSIS — F0390 Unspecified dementia without behavioral disturbance: Secondary | ICD-10-CM | POA: Diagnosis not present

## 2018-08-14 DIAGNOSIS — G459 Transient cerebral ischemic attack, unspecified: Secondary | ICD-10-CM | POA: Insufficient documentation

## 2018-08-14 DIAGNOSIS — Z79899 Other long term (current) drug therapy: Secondary | ICD-10-CM | POA: Diagnosis not present

## 2018-08-14 DIAGNOSIS — R29810 Facial weakness: Secondary | ICD-10-CM | POA: Insufficient documentation

## 2018-08-14 DIAGNOSIS — R531 Weakness: Secondary | ICD-10-CM | POA: Diagnosis not present

## 2018-08-14 DIAGNOSIS — I2510 Atherosclerotic heart disease of native coronary artery without angina pectoris: Secondary | ICD-10-CM | POA: Diagnosis not present

## 2018-08-14 DIAGNOSIS — Z951 Presence of aortocoronary bypass graft: Secondary | ICD-10-CM | POA: Insufficient documentation

## 2018-08-14 DIAGNOSIS — Z87891 Personal history of nicotine dependence: Secondary | ICD-10-CM | POA: Diagnosis not present

## 2018-08-14 DIAGNOSIS — R55 Syncope and collapse: Secondary | ICD-10-CM | POA: Diagnosis present

## 2018-08-14 LAB — COMPREHENSIVE METABOLIC PANEL
ALBUMIN: 3.3 g/dL — AB (ref 3.5–5.0)
ALT: 14 U/L (ref 0–44)
ANION GAP: 7 (ref 5–15)
AST: 22 U/L (ref 15–41)
Alkaline Phosphatase: 88 U/L (ref 38–126)
BUN: 20 mg/dL (ref 8–23)
CALCIUM: 9 mg/dL (ref 8.9–10.3)
CO2: 28 mmol/L (ref 22–32)
Chloride: 109 mmol/L (ref 98–111)
Creatinine, Ser: 1.6 mg/dL — ABNORMAL HIGH (ref 0.61–1.24)
GFR calc non Af Amer: 39 mL/min — ABNORMAL LOW (ref >60)
GFR, EST AFRICAN AMERICAN: 45 mL/min — AB (ref >60)
GLUCOSE: 137 mg/dL — AB (ref 70–99)
Potassium: 4.3 mmol/L (ref 3.5–5.1)
SODIUM: 144 mmol/L (ref 135–145)
Total Bilirubin: 1.3 mg/dL — ABNORMAL HIGH (ref 0.3–1.2)
Total Protein: 7.2 g/dL (ref 6.5–8.1)

## 2018-08-14 LAB — CBC WITH DIFFERENTIAL/PLATELET
Abs Immature Granulocytes: 0.03 10*3/uL (ref 0.00–0.07)
BASOS ABS: 0.1 10*3/uL (ref 0.0–0.1)
Basophils Relative: 1 %
EOS PCT: 3 %
Eosinophils Absolute: 0.2 10*3/uL (ref 0.0–0.5)
HEMATOCRIT: 42.7 % (ref 39.0–52.0)
HEMOGLOBIN: 13.2 g/dL (ref 13.0–17.0)
IMMATURE GRANULOCYTES: 1 %
LYMPHS ABS: 2.3 10*3/uL (ref 0.7–4.0)
LYMPHS PCT: 37 %
MCH: 32.6 pg (ref 26.0–34.0)
MCHC: 30.9 g/dL (ref 30.0–36.0)
MCV: 105.4 fL — ABNORMAL HIGH (ref 80.0–100.0)
Monocytes Absolute: 0.4 10*3/uL (ref 0.1–1.0)
Monocytes Relative: 6 %
NEUTROS PCT: 52 %
NRBC: 0 % (ref 0.0–0.2)
Neutro Abs: 3.4 10*3/uL (ref 1.7–7.7)
Platelets: 108 10*3/uL — ABNORMAL LOW (ref 150–400)
RBC: 4.05 MIL/uL — AB (ref 4.22–5.81)
RDW: 12.8 % (ref 11.5–15.5)
WBC: 6.3 10*3/uL (ref 4.0–10.5)

## 2018-08-14 LAB — TROPONIN I: Troponin I: <0.03 ng/mL (ref <0.03)

## 2018-08-14 LAB — GLUCOSE, CAPILLARY: Glucose-Capillary: 114 mg/dL — ABNORMAL HIGH (ref 70–99)

## 2018-08-14 NOTE — ED Triage Notes (Signed)
Patient from home via [facility]. Per EMS, caregiver noticed patient with sudden onset weakness to the left side. Last known well 945 am. EMS reports patient's left arm was flaccid. Upon arrival to ED, patient moving left arm with equal grip strength. Facial symmetry noted. Patient with no  Intentional verbal response. Unsure if this is baseline. Patient with history of advanced dementia.

## 2018-08-14 NOTE — ED Provider Notes (Signed)
[facility] Emergency Department Provider Note ____________________________________________   I have reviewed the triage vital signs and the triage nursing note.  HISTORY  Chief Complaint Weakness   Historian Level 5 Caveat History Limited by patient has dementia Initial history obtained by EMS who brought him in Daughter who is his power of attorney, as well as caregiver who is with him provide the history after they showed up to the ED  HPI Keith [name] Route is a 83 y.o. male brought in by EMS out of concern for strokelike symptoms.  Reported that patient was going to the bathroom and had some sort of a syncopal event where he was less responsive and his left face was drooping and his left arm was flaccid.  He is a hospice patient and was planning to go to hospice house for 5-day respite stay today.  The caregiver was with the patient when this episode happened and they called the hospice team because they could not get him picked up from where he was in the bathroom and so EMS was called for that reason.  Sound like when EMS got there and there were signs of stroke they discussed with the family to transfer the patient here to the ED.  Daughter is somewhat new to the area and the hospice set up is somewhat new for her to.  She is under a lot of stress taking care of both her father this patient as well as her daughter who has care needs.     Past Medical History:  Diagnosis Date  . Anxiety   . Atrial fibrillation (HCC)   . BPH (benign prostatic hyperplasia)   . Dementia (HCC)   . Heart disease   . MRSA infection   . Subdural hematoma (HCC)   . Syncope and collapse 06/20/2018    Patient Active Problem List   Diagnosis Date Noted  . Infection of skin due to methicillin resistant Staphylococcus aureus (MRSA) 07/02/2018  . Peripheral vascular disease with stasis dermatitis 07/02/2018  . BPH (benign prostatic hyperplasia)   . CAD (coronary artery  disease)   . Dementia with behavioral disturbance (HCC)   . Slow transit constipation   . Hospice care patient   . Intracranial bleeding (HCC) 04/22/2018    Past Surgical History:  Procedure Laterality Date  . CORONARY ARTERY BYPASS GRAFT    . HERNIA REPAIR      Prior to Admission medications   Medication Sig Start Date End Date Taking? Authorizing Provider  acetaminophen (TYLENOL) 325 MG tablet Take 2 tablets (650 mg total) by mouth every 6 (six) hours as needed for mild pain (or Fever >/= 101). 06/12/18  Yes Salary, Montell D, MD  ammonium lactate (LAC-HYDRIN) 12 % lotion Apply 1 application topically every evening. 07/02/18  Yes Galen Manila, NP  clindamycin (CLEOCIN) 300 MG capsule  06/12/18  Yes [provider]  divalproex (DEPAKOTE SPRINKLE) 125 MG capsule Take 250 mg by mouth 2 (two) times daily.   Yes [provider]  donepezil (ARICEPT) 10 MG tablet Take 10 mg by mouth at bedtime.   Yes [provider]  memantine (NAMENDA XR) 28 MG CP24 24 hr capsule Take 28 mg by mouth daily.   Yes [provider]  mirtazapine (REMERON) 7.5 MG tablet Take 7.5 mg by mouth at bedtime.   Yes [provider]  Multiple Vitamin (MULTIVITAMIN) tablet Take 1 tablet by mouth daily.   Yes [provider]  omeprazole (PRILOSEC) 20 MG  capsule Take 1 capsule (20 mg total) by mouth daily. 07/02/18  Yes Galen ManilaKennedy, Lauren Renee, NP  polyethylene glycol (MIRALAX / GLYCOLAX) packet Take 17 g by mouth every other day.    Yes [provider]  tamsulosin (FLOMAX) 0.4 MG CAPS capsule Take 0.4 mg by mouth daily after supper.   Yes [provider]  lactobacillus acidophilus & bulgar (LACTINEX) chewable tablet Chew 1 tablet by mouth 3 (three) times daily with meals. Patient not taking: Reported on 07/02/2018 06/12/18   Salary, Evelena AsaMontell D, MD    No Known Allergies  Family History  Problem Relation Age of Onset  . Hypertension Father      Social History Social History   Tobacco Use  . Smoking status: Former Games developermoker  . Smokeless tobacco: Never Used  Substance Use Topics  . Alcohol use: Never    Frequency: Never  . Drug use: Never    Review of Systems  Constitutional: Negative for recent illness. Eyes: Negative for visual changes. ENT: Negative for sore throat. Cardiovascular: Negative for chest pain. Respiratory: Negative for shortness of breath. Gastrointestinal: Negative for abdominal pain, vomiting and diarrhea. Genitourinary: Negative for dysuria. Musculoskeletal: Negative for back pain. Skin: Negative for rash. Neurological: Negative for headache.  ____________________________________________   PHYSICAL EXAM:  VITAL SIGNS: ED Triage Vitals  Enc Vitals Group     BP 08/14/18 1018 127/76     Pulse Rate 08/14/18 1018 73     Resp 08/14/18 1018 (!) 22     Temp 08/14/18 1018 97.8 F (36.6 C)     Temp Source 08/14/18 1018 Axillary     SpO2 08/14/18 1018 94 %     Weight 08/14/18 1020 180 lb (81.6 kg)     Height 08/14/18 1020 5\' 6"  (1.676 m)     Head Circumference --      Peak Flow --      Pain Score 08/14/18 1019 5     Pain Loc --      Pain Edu? --      Excl. in GC? --      Constitutional: Alert but does not speak or really follow commands.  His eyes are open and he is looking around smiling. HEENT      Head: Normocephalic and atraumatic.      Eyes: Conjunctivae are normal. Pupils equal and round.       Ears:         Nose: No congestion/rhinnorhea.      Mouth/Throat: Mucous membranes are moist.      Neck: No stridor. Cardiovascular/Chest: Normal rate, regular rhythm.  No murmurs, rubs, or gallops. Respiratory: Normal respiratory effort without tachypnea nor retractions. Breath sounds are clear and equal bilaterally. No wheezes/rales/rhonchi. Gastrointestinal: Soft. No distention, no guarding, no rebound. Nontender.    Genitourinary/rectal:Deferred Musculoskeletal: Nontender with normal  range of motion in all extremities. No joint effusions.  No lower extremity tenderness.  No edema. Neurologic: No facial droop.  Not speaking, but reported essentially baseline.  No gross or focal neurologic deficits are appreciated.  He is able to move all 4 extremities including the left upper extremity which was reported to be flaccid earlier. Skin:  Skin is warm, dry and intact. No rash noted. Psychiatric: No agitation  ____________________________________________  LABS (pertinent positives/negatives) I, Governor Rooksebecca Trenden Hazelrigg, MD the attending physician have reviewed the labs noted below.  Labs Reviewed  GLUCOSE, CAPILLARY - Abnormal; Notable for the following components:      Result Value   Glucose-Capillary  114 (*)    All other components within normal limits  COMPREHENSIVE METABOLIC PANEL - Abnormal; Notable for the following components:   Glucose, Bld 137 (*)    Creatinine, Ser 1.60 (*)    Albumin 3.3 (*)    Total Bilirubin 1.3 (*)    GFR calc non Af Amer 39 (*)    GFR calc Af Amer 45 (*)    All other components within normal limits  CBC WITH DIFFERENTIAL/PLATELET - Abnormal; Notable for the following components:   RBC 4.05 (*)    MCV 105.4 (*)    Platelets 108 (*)    All other components within normal limits  TROPONIN I  URINALYSIS, COMPLETE (UACMP) WITH MICROSCOPIC    ____________________________________________    EKG I, Governor Rooks, MD, the attending physician have personally viewed and interpreted all ECGs.  78 bpm.  Normal sinus rhythm.  Nonspecific intraventricular conduction delay.  Right axis deviation.  Nonspecific ST/T wave ____________________________________________  RADIOLOGY   CT head without contrast:  IMPRESSION: 1. Subacute appearing subdural hematomas bilaterally, smaller and demonstrating less mass effect on the respective frontal lobes compared to approximately 2 months prior. No new hemorrhage evident. No intra-axial hemorrhage or mass. There is  underlying atrophy with periventricular small vessel disease. No acute infarct is demonstrable on this current examination. No midline shift.  2. There are foci of arterial vascular calcification.  3. Multifocal paranasal sinus disease noted. __________________________________________  PROCEDURES  Procedure(s) performed: None  Procedures  Critical Care performed: None   ____________________________________________  ED COURSE / ASSESSMENT AND PLAN  Pertinent labs & imaging results that were available during my care of the patient were reviewed by me and considered in my medical decision making (see chart for details).     On arrival patient no longer has any neurologic deficits that were reported earlier.  It sounds like the symptoms may have lasted about 1/2-hour and now they are better.  Sound like was probably a TIA.  I was able to confirm with the family that the patient is not on blood thinner secondary to head bleed.  His head CT today shows improvement of the subdural hematomas.  But again he cannot be on blood thinners given the head bleed.  He is on hospice, and care in the hospice liaison was able to come to the bedside and also speak with the patient's daughter, his healthcare power of attorney, regarding the plan of management.  He is going to go ahead and go to hospice home today.  We discussed that there was no real benefit or advantage to hospital stay as symptoms are resolved now, and he cannot be on blood thinner for stroke/TIA treatment.  We discussed at length following closely with the hospice team.      CONSULTATIONS:   None  Patient / Family / Caregiver informed of clinical course, medical decision-making process, and agree with plan.   I discussed return precautions, follow-up instructions, and discharge instructions with patient and/or family.  Discharge Instructions : Please discuss with your Hospice physician the best plan moving forward.  Return to the ER as needed if there is an acute change that is concerning to you and/or Hospice team.    ___________________________________________   FINAL CLINICAL IMPRESSION(S) / ED DIAGNOSES   Final diagnoses:  TIA (transient ischemic attack)      ___________________________________________         Note: This dictation was prepared with Dragon dictation. Any transcriptional errors that result from this process  are unintentional    Governor Rooks, MD 08/14/18 1337

## 2018-08-14 NOTE — ED Notes (Signed)
Pt given meal tray and drink. Family at bedside

## 2018-08-14 NOTE — ED Notes (Signed)
Pt cleaned up and dressed for discharge.

## 2018-08-14 NOTE — Progress Notes (Signed)
ED visit made. Patient is currently followed by [facility] at home with a hospice diagnosis of heart disease. He is a DNR code. [facility] ED today for evaluation of a syncopal episode at home resulting in left sided weakness and facial droop. Symptoms had resolved upon arrival to the ED.  Patient seen lying on the Ed stretcher, alert and awake. Daughter Margaretha GlassingLoretta at beside, very upset and concerned. Patient was scheduled for 5 night respite stay at the hospice home starting today. Much discussion and reassurance from both writer and  EDP Dr. Shaune PollackLord provided. Margaretha GlassingLoretta did decide to proceed with the scheduled respite. She will transport the patient via [facility] after she goes home to get his clothes and medications. EDP and RN Lelon MastSamantha updated. Report called to the hospice home. Updated notes to be faxed  when posted. Dayna BarkerKaren Robertson RN, BSN, [facility], hospital liaison [phone]

## 2018-08-14 NOTE — Discharge Instructions (Addendum)
Please discuss with your Hospice physician the best plan moving forward. Return to the ER as needed if there is an acute change that is concerning to you and/or Hospice team.

## 2018-09-26 ENCOUNTER — Emergency Department

## 2018-09-26 ENCOUNTER — Emergency Department
Admission: EM | Admit: 2018-09-26 | Discharge: 2018-09-26 | Disposition: A | Discharge status: Home / Self Care | Attending: Emergency Medicine | Admitting: Emergency Medicine

## 2018-09-26 ENCOUNTER — Encounter: Payer: Self-pay | Admitting: Emergency Medicine

## 2018-09-26 ENCOUNTER — Other Ambulatory Visit: Payer: Self-pay

## 2018-09-26 DIAGNOSIS — D72829 Elevated white blood cell count, unspecified: Secondary | ICD-10-CM | POA: Diagnosis not present

## 2018-09-26 DIAGNOSIS — I2510 Atherosclerotic heart disease of native coronary artery without angina pectoris: Secondary | ICD-10-CM | POA: Insufficient documentation

## 2018-09-26 DIAGNOSIS — N3000 Acute cystitis without hematuria: Secondary | ICD-10-CM | POA: Diagnosis not present

## 2018-09-26 DIAGNOSIS — F0390 Unspecified dementia without behavioral disturbance: Secondary | ICD-10-CM | POA: Diagnosis not present

## 2018-09-26 DIAGNOSIS — Z87891 Personal history of nicotine dependence: Secondary | ICD-10-CM | POA: Insufficient documentation

## 2018-09-26 DIAGNOSIS — R55 Syncope and collapse: Secondary | ICD-10-CM | POA: Diagnosis present

## 2018-09-26 DIAGNOSIS — Z79899 Other long term (current) drug therapy: Secondary | ICD-10-CM | POA: Diagnosis not present

## 2018-09-26 LAB — CBC
HEMATOCRIT: 39.9 % (ref 39.0–52.0)
HEMOGLOBIN: 12.9 g/dL — AB (ref 13.0–17.0)
MCH: 33.6 pg (ref 26.0–34.0)
MCHC: 32.3 g/dL (ref 30.0–36.0)
MCV: 103.9 fL — AB (ref 80.0–100.0)
PLATELETS: 168 10*3/uL (ref 150–400)
RBC: 3.84 MIL/uL — AB (ref 4.22–5.81)
RDW: 13.1 % (ref 11.5–15.5)
WBC: 24.7 10*3/uL — ABNORMAL HIGH (ref 4.0–10.5)
nRBC: 0 % (ref 0.0–0.2)

## 2018-09-26 LAB — URINALYSIS, COMPLETE (UACMP) WITH MICROSCOPIC
BILIRUBIN URINE: NEGATIVE
GLUCOSE, UA: NEGATIVE mg/dL
Ketones, ur: NEGATIVE mg/dL
Nitrite: NEGATIVE
PROTEIN: 30 mg/dL — AB
Specific Gravity, Urine: 1.019 (ref 1.005–1.030)
pH: 5 (ref 5.0–8.0)

## 2018-09-26 LAB — BASIC METABOLIC PANEL
Anion gap: 7 (ref 5–15)
BUN: 32 mg/dL — AB (ref 8–23)
CO2: 25 mmol/L (ref 22–32)
CREATININE: 1.96 mg/dL — AB (ref 0.61–1.24)
Calcium: 8.8 mg/dL — ABNORMAL LOW (ref 8.9–10.3)
Chloride: 106 mmol/L (ref 98–111)
GFR calc Af Amer: 36 mL/min — ABNORMAL LOW (ref >60)
GFR calc non Af Amer: 31 mL/min — ABNORMAL LOW (ref >60)
GLUCOSE: 106 mg/dL — AB (ref 70–99)
POTASSIUM: 5.5 mmol/L — AB (ref 3.5–5.1)
SODIUM: 138 mmol/L (ref 135–145)

## 2018-09-26 MED ORDER — CEPHALEXIN 500 MG PO CAPS: 500.0000 mg | ORAL_CAPSULE | Freq: Three times a day (TID) | ORAL | 0 refills | Status: AC

## 2018-09-26 MED ORDER — SODIUM CHLORIDE 0.9 % IV SOLN
1.0000 g | Freq: Once | INTRAVENOUS | Status: AC
  Administered 2018-09-26: 1 g via INTRAVENOUS
  Filled 2018-09-26: qty 10

## 2018-09-26 MED ORDER — SODIUM CHLORIDE 0.9 % IV BOLUS
1000.0000 mL | Freq: Once | INTRAVENOUS | Status: AC
  Administered 2018-09-26: 1000 mL via INTRAVENOUS

## 2018-09-26 NOTE — ED Notes (Signed)
Patient out of ED via [facility]. Patient changed prior to discharge. Patient left in no distress.

## 2018-09-26 NOTE — ED Provider Notes (Addendum)
[facility] Emergency Department Provider Note  ____________________________________________   I have reviewed the triage vital signs and the nursing notes. Where available I have reviewed prior notes and, if possible and indicated, outside hospital notes.    HISTORY  Chief Complaint Loss of Consciousness    HPI Keith [name] is a 84 y.o. male  Who unfortunately has end-stage Alzheimer's, history of subdural hematomas, is DNR by the wishes of his daughter who is power of attorney at bedside, who is in hospice and actually was just in hospice house, presents today completely at his baseline, apparently he either passed out or had a near syncopal episode in his bed today.  No trauma.  This is not an unusual occurrence I have been an extensive conversation with his hospice nurse they know him very well, family and hospice are very interested in limiting the work-up they do agree with blood work they do not want CT head or any other acute imaging, and they would like him to go home.  Patient is at the end of his life and family are very clear that he would not want to is declining years be medicalized  Level 5 chart caveat; no further history available due to patient status.   Past Medical History:  Diagnosis Date  . Anxiety   . Atrial fibrillation (HCC)   . BPH (benign prostatic hyperplasia)   . Dementia (HCC)   . Heart disease   . MRSA infection   . Subdural hematoma (HCC)   . Syncope and collapse 06/20/2018    Patient Active Problem List   Diagnosis Date Noted  . Infection of skin due to methicillin resistant Staphylococcus aureus (MRSA) 07/02/2018  . Peripheral vascular disease with stasis dermatitis 07/02/2018  . BPH (benign prostatic hyperplasia)   . CAD (coronary artery disease)   . Dementia with behavioral disturbance (HCC)   . Slow transit constipation   . Hospice care patient   . Intracranial bleeding (HCC) 04/22/2018    Past Surgical History:   Procedure Laterality Date  . CORONARY ARTERY BYPASS GRAFT    . HERNIA REPAIR      Prior to Admission medications   Medication Sig Start Date End Date Taking? Authorizing Provider  acetaminophen (TYLENOL) 325 MG tablet Take 2 tablets (650 mg total) by mouth every 6 (six) hours as needed for mild pain (or Fever >/= 101). 06/12/18   Salary, Jetty DuhamelMontell D, MD  ammonium lactate (LAC-HYDRIN) 12 % lotion Apply 1 application topically every evening. 07/02/18   Galen ManilaKennedy, Lauren Renee, NP  clindamycin (CLEOCIN) 300 MG capsule  06/12/18   [provider]  divalproex (DEPAKOTE SPRINKLE) 125 MG capsule Take 250 mg by mouth 2 (two) times daily.    [provider]  donepezil (ARICEPT) 10 MG tablet Take 10 mg by mouth at bedtime.    [provider]  lactobacillus acidophilus & bulgar (LACTINEX) chewable tablet Chew 1 tablet by mouth 3 (three) times daily with meals. Patient not taking: Reported on 07/02/2018 06/12/18   Salary, Jetty DuhamelMontell D, MD  memantine (NAMENDA XR) 28 MG CP24 24 hr capsule Take 28 mg by mouth daily.    [provider]  mirtazapine (REMERON) 7.5 MG tablet Take 7.5 mg by mouth at bedtime.    [provider]  Multiple Vitamin (MULTIVITAMIN) tablet Take 1 tablet by mouth daily.    [provider]  omeprazole (PRILOSEC) 20 MG capsule Take 1 capsule (20 mg total) by mouth daily. 07/02/18  Galen ManilaKennedy, Lauren Renee, NP  polyethylene glycol [facility](MIRALAX / GLYCOLAX) packet Take 17 g by mouth every other day.     [provider]  tamsulosin (FLOMAX) 0.4 MG CAPS capsule Take 0.4 mg by mouth daily after supper.    [provider]    Allergies Patient has no known allergies.  Family History  Problem Relation Age of Onset  . Hypertension Father     Social History Social History   Tobacco Use  . Smoking status: Former Games developermoker  . Smokeless tobacco: Never Used  Substance Use Topics  . Alcohol use: Never    Frequency: Never  . Drug use:  Never    Review of Systems Level 5 chart caveat; no further history available due to patient status.  ____________________________________________   PHYSICAL EXAM:  VITAL SIGNS: ED Triage Vitals  Enc Vitals Group     BP 09/26/18 1202 106/61     Pulse --      Resp 09/26/18 1202 19     Temp 09/26/18 1202 98.1 F (36.7 C)     Temp src --      SpO2 09/26/18 1202 95 %     Weight 09/26/18 1203 178 lb 9.2 oz (81 kg)     Height 09/26/18 1203 6' (1.829 m)     Head Circumference --      Peak Flow --      Pain Score --      Pain Loc --      Pain Edu? --      Excl. in GC? --     Constitutional: Alert and smiling presently answers yes to every question at his baseline per family Eyes: Conjunctivae are normal Head: Atraumatic HEENT: No congestion/rhinnorhea. Mucous membranes are moist.  Oropharynx non-erythematous Neck:   Nontender with no meningismus, no masses, no stridor Cardiovascular: Normal rate, regular rhythm. Grossly normal heart sounds.  Good peripheral circulation. Respiratory: Normal respiratory effort.  No retractions. Lungs CTAB. Abdominal: Soft and nontender. No distention. No guarding no rebound Back:  There is no focal tenderness or step off.  there is no midline tenderness there are no lesions noted. there is no CVA tenderness Musculoskeletal: No lower extremity tenderness, no upper extremity tenderness. No joint effusions, no DVT signs strong distal pulses no edema Neurologic:  Normal speech and language. No gross focal neurologic deficits are appreciated.  Skin:  Skin is warm, dry and intact. No rash noted.   ____________________________________________   LABS (all labs ordered are listed, but only abnormal results are displayed)  Labs Reviewed  BASIC METABOLIC PANEL  CBC  URINALYSIS, COMPLETE (UACMP) WITH MICROSCOPIC  CBG MONITORING, ED    Pertinent labs  results that were available during my care of the patient were reviewed by me and considered in my  medical decision making (see chart for details). ____________________________________________  EKG  I personally interpreted any EKGs ordered by me or triage  ____________________________________________  RADIOLOGY  Pertinent labs & imaging results that were available during my care of the patient were reviewed by me and considered in my medical decision making (see chart for details). If possible, patient and/or family made aware of any abnormal findings.  No results found. ____________________________________________    PROCEDURES  Procedure(s) performed: None  Procedures  Critical Care performed: None  ____________________________________________   INITIAL IMPRESSION / ASSESSMENT AND PLAN / ED COURSE  Pertinent labs & imaging results that were available during my care of the patient were reviewed by me and considered in  my medical decision making (see chart for details).  Patient here after passing out or having a syncopal event, unclear what exactly happened.  However, he is at his baseline now and family and hospice do not wish any significant work-up.  We already have blood work and they are happy to see if there is anything we can address on that but they absolutely do not wish to be admitted they do not wish a CT scan of his head they do not wish a cardiac evaluation they are happy with how he looks now and would like to take him home.  They understand the risk benefits and alternatives of this course of action and the course they are trying to follow the dictates that the patient's last known intentions for his own health as best they can.  Of note, patient's hospice nurse did present to me his DNR form but it was noticed that his name was not fully filled out on the form.  After consultation with his daughter, who again is power of attorney, and hospice, I filled out the form correctly for them.  ----------------------------------------- 4:40 PM on  09/26/2018 ----------------------------------------- Patient with urinary tract infection elevation of potassium and BUN/creatinine ratio suggestive of dehydration, leukocytosis also noted.  Not septic in appearance, well-appearing, in no acute distress at his baseline.  I have offered admission we had a long talk with the daughter about admission versus discharge and they are very strongly in favor of discharge given that his hospice nature and the fact that he does not want to be medicalized.  We will discharge this patient therefore at daughter's request, return precautions follow-up given and understood    ____________________________________________   FINAL CLINICAL IMPRESSION(S) / ED DIAGNOSES  Final diagnoses:  None      This chart was dictated using voice recognition software.  Despite best efforts to proofread,  errors can occur which can change meaning.      Jeanmarie Plant, MD 09/26/18 1259    Jeanmarie Plant, MD 09/26/18 [phone]

## 2018-09-26 NOTE — Discharge Instructions (Addendum)
Keith [name] is DNR, and you  would prefer that he go home which is certainly not an unreasonable choice but it does limit to some extent our ability to continue to observe him.  For this reason we are giving him IV antibiotics here, and we will have him take antibiotics for what looks to be a UTI until he gets better.  Please follow closely with his doctor first thing on Monday take the antibiotics as directed, if you are concerned about him and ways that you wish to have [place] intercede upon, please return to the emergency room.  Otherwise, stay in close contact with the home nursing, hospice care and your primary care doctor.

## 2018-09-26 NOTE — Progress Notes (Signed)
Second visit made to provide emotional support to patient's daughter and discuss discharge plan. Margaretha GlassingLoretta voiced understanding that patient did not at this time need admission to the hospital. He will receive a dose of IV antibiotic and a liter of fluid here in the ED and will discharge home via EMS. Margaretha GlassingLoretta voiced her understanding and agreement. Margaretha GlassingLoretta and the patient given warm blankets, food and drink. Much emotional support provided. Hospice team and Hospital care team updated. Thank you. Dayna BarkerKaren Robertson RN, BSN, [facility] [place], hospital Liaison [phone]

## 2018-09-26 NOTE — ED Triage Notes (Signed)
Pt in via [facility] from home.  Per EMS, home health RN getting patient up for shower, pt became faint, getting him back to bed, where he then had a syncopal episode.  Pt with dementia at baseline.  Pt alert, disoriented x 4.  Vitals WDL.

## 2018-09-26 NOTE — Progress Notes (Signed)
ED visit made. Patient is currently followed by [facility] at home with a hospice diagnosis of heart disease. He is a DNR code. He came to the [facility] ED today for evaluation of a syncopal episode at home. His daughter did contact hospice prior to having him transported. Patient seen lying on the ED stretcher, alert, no signs of discomfort/distress. Daughter Margaretha Glassing at bedside. Loreta reported the events of the morning. Patient has been known to have syncopal episodes in the past. Discussed interventions with Margaretha Glassing and EDP Dr. Alphonzo Lemmings, no CT to be ordered, labs to be collected and abnormalities addressed and corrected if possible. Emotional support given through out the visit. Will continue to follow and update hospice team. Plan is for discharge home via EMS following lab results. Dayna Barker RN, BSN, [facility] [place], [facility] liaison [phone]

## 2018-09-28 LAB — URINE CULTURE: Culture: >=100000 — AB

## 2018-11-12 ENCOUNTER — Other Ambulatory Visit: Payer: Self-pay

## 2018-11-12 ENCOUNTER — Encounter: Payer: Self-pay | Admitting: Nurse Practitioner

## 2018-11-12 ENCOUNTER — Ambulatory Visit (INDEPENDENT_AMBULATORY_CARE_PROVIDER_SITE_OTHER): Payer: Self-pay | Admitting: Nurse Practitioner

## 2018-11-12 VITALS — BP 113/81 | HR 93 | Ht 72.0 in | Wt 172.6 lb

## 2018-11-12 DIAGNOSIS — R32 Unspecified urinary incontinence: Secondary | ICD-10-CM

## 2018-11-12 DIAGNOSIS — L22 Diaper dermatitis: Secondary | ICD-10-CM

## 2018-11-12 DIAGNOSIS — B372 Candidiasis of skin and nail: Secondary | ICD-10-CM

## 2018-11-12 DIAGNOSIS — L258 Unspecified contact dermatitis due to other agents: Secondary | ICD-10-CM

## 2018-11-12 MED ORDER — CLOTRIMAZOLE-BETAMETHASONE 1-0.05 % EX CREA: 1.0000 "application " | TOPICAL_CREAM | Freq: Two times a day (BID) | CUTANEOUS | 0 refills | Status: AC

## 2018-11-12 NOTE — Patient Instructions (Addendum)
Keith [name],   Thank you for coming in to clinic today.  1.  Moisture barrier cream - petroleum based.  2. Use medicated anti-fungal and steroid cream twice daily for 7 days.  Please schedule a follow-up appointment with [name], AGNP. Return if symptoms worsen or fail to improve.  If you have any other questions or concerns, please feel free to call the clinic or send a message through MyChart. You may also schedule an earlier appointment if necessary.  You will receive a survey after today's visit either digitally by e-mail or paper by [employer]. Your experiences and feedback matter to [place].  Please respond so we know how we are doing as we provide care for you.   [name], DNP, AGNP-BC Adult Gerontology Nurse Practitioner [facility], [facility]

## 2018-11-12 NOTE — Progress Notes (Signed)
Subjective:    Patient ID: Keith [name], male    DOB: [birth year], 84 y.o.   MRN: [id number]  Keith [name] is a 84 y.o. male presenting on 11/12/2018 for Rash (diagnose w/ jock itch x 1 week ago. Rash spreaded from the pelvic area and on abdomen)   HPI Rash Jock itch diagnosis was made by hospice care team 1 week ago (8-9) days and is now spreading further up to abdomen.  Patient's daughter has been using zinc oxide ointment since that time without significant relief and continued spreading of rash.   Location of rash is only under diapered area.  Patient is wearing diaper brief with two menstrual pads placed inside.  Daughter states this is so it can collect more urine.  Patient was not having leaking onto clothing.  Daughter states her father voids very infrequently, intermittently. Appears that at times he can control his bladder, and other times has urinary incontinence every 20-30 minutes.  Social History   Tobacco Use  . Smoking status: Former Games developer  . Smokeless tobacco: Never Used  Substance Use Topics  . Alcohol use: Never    Frequency: Never  . Drug use: Never    Review of Systems Per HPI unless specifically indicated above     Objective:    BP 113/81 (BP Location: Left Arm, Patient Position: Sitting, Cuff Size: Normal)   Pulse 93   Ht 6' (1.829 m)   Wt 172 lb 9.6 oz (78.3 kg)   BMI 23.41 kg/m   Wt Readings from Last 3 Encounters:  11/12/18 172 lb 9.6 oz (78.3 kg)  09/26/18 178 lb 9.2 oz (81 kg)  08/14/18 180 lb (81.6 kg)    Physical Exam Vitals signs reviewed.  Constitutional:      General: He is not in acute distress.    Appearance: He is well-developed.  HENT:     Head: Normocephalic and atraumatic.  Cardiovascular:     Rate and Rhythm: Normal rate and regular rhythm.     Pulses:          Radial pulses are 2+ on the right side and 2+ on the left side.       Posterior tibial pulses are 1+ on the right side and 1+ on the left side.     Heart sounds:  Normal heart sounds, S1 normal and S2 normal.  Pulmonary:     Effort: Pulmonary effort is normal. No respiratory distress.     Breath sounds: Normal breath sounds and air entry.  Genitourinary:      Comments: - Darkened, hyperkeratotic, flaky skin marked in blue over suprapubic region - Pustular lesions with central scabbing consistent with excoration marked in red lateral diaper area - Penile lesions, bleeding of skin/weeping marked in gray, also present on scrotum with some darkened,  Hyperkeratotic lesions as on suprapubic area - Perineum behind scrotum intact without evidence of any stool related skin breakdown. Musculoskeletal:     Right lower leg: No edema.     Left lower leg: No edema.  Skin:    General: Skin is warm and dry.     Capillary Refill: Capillary refill takes less than 2 seconds.  Neurological:     Mental Status: He is alert and oriented to person, place, and time.  Psychiatric:        Attention and Perception: Attention normal.        Mood and Affect: Mood and affect normal.  Behavior: Behavior normal. Behavior is cooperative.        Assessment & Plan:   Problem List Items Addressed This Visit    None    Visit Diagnoses    Candidal diaper rash    -  Primary   Relevant Medications   clotrimazole-betamethasone (LOTRISONE) cream   Dermatitis associated with moisture from urinary incontinence          Acute worsening of dermatitis from MASD.  Urinary incontinence present 2/2 advancing dementia.  Patient now with suspicious rash for candidal infection.  No significant improvement with zinc oxide ointment.  Plan: 1. START Lotrisone ointment bid x 7 days.  Reviewed signs and symptoms of skin atrophy.  May need to transition to nystatin ointment only if continues. 2. Continue zinc oxide cream only prn.  Hold at this time. 3. Use moisture barrier cream after every briefs change. 4. Stop using additional pads in briefs.  Change as frequently as briefs  become soiled. 5. Avoid excoriation by keeping patient in pants with belt. 6. Follow-up 5-7 days prn.  Continue utilization of hospice/palliative care staff for assessment.  Meds ordered this encounter  Medications  . clotrimazole-betamethasone (LOTRISONE) cream    Sig: Apply 1 application topically 2 (two) times daily for 7 days.    Dispense:  90 g    Refill:  0    Order Specific Question:   Supervising Provider    Answer:   Smitty Cords [[id number]]   Patient arrived 15 mins late to appointment, patient's daughter demanded to be seen.  Office staff have communicated to patient and daughter that they will be seen today only due to next patient no-show.  Patient's daughter continued to be rude and demanding.  This was reinforced by CMA upon rooming that patient may not be seen in future if arriving late.  Again, daughter was rude and demanded that her father would have been seen today even if later patient had arrived.  I did not address this issue directly with patient or daughter, but will need to be reinforced with patient's daughter at later date if this behavior is recurrent.  Follow up plan: Return if symptoms worsen or fail to improve.  [name], DNP, AGPCNP-BC Adult Gerontology Primary Care Nurse Practitioner [facility] 11/12/2018, 10:18 AM

## 2019-05-13 ENCOUNTER — Ambulatory Visit: Payer: Self-pay | Admitting: Nurse Practitioner

## 2019-05-14 ENCOUNTER — Ambulatory Visit
Admission: RE | Admit: 2019-05-14 | Discharge: 2019-05-14 | Disposition: A | Discharge status: Home / Self Care | Source: Ambulatory Visit | Attending: Nurse Practitioner | Admitting: Nurse Practitioner

## 2019-05-14 ENCOUNTER — Other Ambulatory Visit: Payer: Self-pay | Admitting: Nurse Practitioner

## 2019-05-14 ENCOUNTER — Ambulatory Visit
Admission: RE | Admit: 2019-05-14 | Discharge: 2019-05-14 | Disposition: A | Discharge status: Home / Self Care | Attending: Nurse Practitioner | Admitting: Nurse Practitioner

## 2019-05-14 ENCOUNTER — Ambulatory Visit (INDEPENDENT_AMBULATORY_CARE_PROVIDER_SITE_OTHER): Payer: Medicare Other | Admitting: Nurse Practitioner

## 2019-05-14 ENCOUNTER — Other Ambulatory Visit: Payer: Self-pay

## 2019-05-14 ENCOUNTER — Encounter: Payer: Self-pay | Admitting: Nurse Practitioner

## 2019-05-14 VITALS — BP 117/83 | HR 73

## 2019-05-14 DIAGNOSIS — M79641 Pain in right hand: Secondary | ICD-10-CM | POA: Diagnosis not present

## 2019-05-14 DIAGNOSIS — R21 Rash and other nonspecific skin eruption: Secondary | ICD-10-CM

## 2019-05-14 MED ORDER — DICLOFENAC SODIUM 1 % TD GEL: 2.0000 g | Freq: Four times a day (QID) | TRANSDERMAL | 1 refills | Status: AC

## 2019-05-14 MED ORDER — TRIAMCINOLONE ACETONIDE 0.025 % EX OINT: 1.0000 "application " | TOPICAL_OINTMENT | Freq: Two times a day (BID) | CUTANEOUS | 0 refills | Status: AC

## 2019-05-14 NOTE — Patient Instructions (Addendum)
Keith [name],   Thank you for coming in to clinic today.  1. For hand - xrays today - diclofenac gel to hand four times daily as needed for pain.  2. Belly button - continue triamcinolone ointment  Please schedule a follow-up appointment with [name], AGNP. Return if symptoms worsen or fail to improve.  If you have any other questions or concerns, please feel free to call the clinic or send a message through [place]. You may also schedule an earlier appointment if necessary.  You will receive a survey after today's visit either digitally by e-mail or paper by [employer]. Your experiences and feedback matter to [place].  Please respond so we know how we are doing as we provide care for you.   [name], DNP, AGNP-BC Adult Gerontology Nurse Practitioner [place]

## 2019-05-14 NOTE — Progress Notes (Signed)
Subjective:    Patient ID: Keith [name], male    DOB: 1934-12-12, 84 y.o.   MRN: [id number]  Keith [name] is a 84 y.o. male presenting on 05/14/2019 for Hand Pain (Right hand discomfort notice with movement and touching the hand x 2 mths )  HPI Right hand "hurts" Patient is 84 year old male who has advanced dementia and is not very communicative.  He is currently under hospice services.  He has repeatedly told his caregivers that his hand hurts with movement.  Patient's caregivers note swollen wrist, mid hand.  Tylenol does not seem to help.  No known injury or trauma.  Patient has had pain for the last 2 months.  Social History   Tobacco Use  . Smoking status: Former Research scientist (life sciences)  . Smokeless tobacco: Never Used  Substance Use Topics  . Alcohol use: Never    Frequency: Never  . Drug use: Never    Review of Systems Per HPI unless specifically indicated above     Objective:    BP 117/83 (BP Location: Left Arm, Patient Position: Sitting, Cuff Size: Normal)   Pulse 73   Wt Readings from Last 3 Encounters:  11/12/18 172 lb 9.6 oz (78.3 kg)  09/26/18 178 lb 9.2 oz (81 kg)  08/14/18 180 lb (81.6 kg)    Physical Exam Vitals signs reviewed.  Constitutional:      General: He is not in acute distress.    Appearance: He is well-developed.  HENT:     Head: Normocephalic and atraumatic.  Cardiovascular:     Rate and Rhythm: Normal rate and regular rhythm.     Pulses:          Radial pulses are 2+ on the right side and 2+ on the left side.       Posterior tibial pulses are 1+ on the right side and 1+ on the left side.     Heart sounds: Normal heart sounds, S1 normal and S2 normal.  Pulmonary:     Effort: Pulmonary effort is normal. No respiratory distress.     Breath sounds: Normal breath sounds and air entry.  Musculoskeletal:     Right lower leg: No edema.     Left lower leg: No edema.     Comments: LEFT hand Unable to straighten hand/fingers/wrist.  Patient has pain when  extending fully.  Edema noted at mid-hand to wrist.  No bony tenderness. Normal sensation to best of exam/pt communication.  Good cap refill.  Skin:    General: Skin is warm and dry.     Capillary Refill: Capillary refill takes less than 2 seconds.       Neurological:     Mental Status: He is alert and oriented to person, place, and time. Mental status is at baseline.  Psychiatric:        Attention and Perception: Attention normal.        Mood and Affect: Mood and affect normal.        Behavior: Behavior normal. Behavior is cooperative.      Results for orders placed or performed during the hospital encounter of 09/26/18  Urine culture   Specimen: Urine, Random  Result Value Ref Range   Specimen Description      URINE, RANDOM Performed at [facility], [address], [place], [place] [address]    Special Requests      NONE Performed at [facility], [address] [place], [place] [address]  Culture >=100,000 COLONIES/mL KLEBSIELLA PNEUMONIAE (A)    Report Status 09/28/2018 FINAL    Organism ID, Bacteria KLEBSIELLA PNEUMONIAE (A)       Susceptibility   Klebsiella pneumoniae - MIC*    AMPICILLIN >=32 RESISTANT Resistant     CEFAZOLIN <=4 SENSITIVE Sensitive     CEFTRIAXONE <=1 SENSITIVE Sensitive     CIPROFLOXACIN <=0.25 SENSITIVE Sensitive     GENTAMICIN <=1 SENSITIVE Sensitive     IMIPENEM <=0.25 SENSITIVE Sensitive     NITROFURANTOIN 64 INTERMEDIATE Intermediate     TRIMETH/SULFA >=320 RESISTANT Resistant     AMPICILLIN/SULBACTAM >=32 RESISTANT Resistant     PIP/TAZO <=4 SENSITIVE Sensitive     Extended ESBL NEGATIVE Sensitive     * >=100,000 COLONIES/mL KLEBSIELLA PNEUMONIAE  CBC  Result Value Ref Range   WBC 24.7 (H) 4.0 - 10.5 K/uL   RBC 3.84 (L) 4.22 - 5.81 MIL/uL   Hemoglobin 12.9 (L) 13.0 - 17.0 g/dL   HCT 16.139.9 09.639.0 - 04.552.0 %   MCV 103.9 (H) 80.0 - 100.0 fL   MCH 33.6 26.0 - 34.0 pg   MCHC 32.3 30.0 - 36.0 g/dL   RDW 40.913.1 81.111.5 - 91.415.5  %   Platelets 168 150 - 400 K/uL   nRBC 0.0 0.0 - 0.2 %  Urinalysis, Complete w Microscopic  Result Value Ref Range   Color, Urine AMBER (A) YELLOW   APPearance CLOUDY (A) CLEAR   Specific Gravity, Urine 1.019 1.005 - 1.030   pH 5.0 5.0 - 8.0   Glucose, UA NEGATIVE NEGATIVE mg/dL   Hgb urine dipstick MODERATE (A) NEGATIVE   Bilirubin Urine NEGATIVE NEGATIVE   Ketones, ur NEGATIVE NEGATIVE mg/dL   Protein, ur 30 (A) NEGATIVE mg/dL   Nitrite NEGATIVE NEGATIVE   Leukocytes, UA MODERATE (A) NEGATIVE   RBC / HPF 6-10 0 - 5 RBC/hpf   WBC, UA 21-50 0 - 5 WBC/hpf   Bacteria, UA RARE (A) NONE SEEN   Squamous Epithelial / LPF 0-5 0 - 5   WBC Clumps PRESENT    Mucus PRESENT    Hyaline Casts, UA PRESENT   Basic metabolic panel  Result Value Ref Range   Sodium 138 135 - 145 mmol/L   Potassium 5.5 (H) 3.5 - 5.1 mmol/L   Chloride 106 98 - 111 mmol/L   CO2 25 22 - 32 mmol/L   Glucose, Bld 106 (H) 70 - 99 mg/dL   BUN 32 (H) 8 - 23 mg/dL   Creatinine, Ser 7.821.96 (H) 0.61 - 1.24 mg/dL   Calcium 8.8 (L) 8.9 - 10.3 mg/dL   GFR calc non Af Amer 31 (L) >60 mL/min   GFR calc Af Amer 36 (L) >60 mL/min   Anion gap 7 5 - 15      Assessment & Plan:   Problem List Items Addressed This Visit    None    Visit Diagnoses    Right hand pain    -  Primary Pain likely self-limited, but cannot exclude fracture.  Also may have component of arthritis, effusion.  Plan:  1. Treat with OTC pain meds (acetaminophen and ibuprofen).  Discussed alternate dosing and max dosing. 2. Apply heat and/or ice to affected area. 3. May also apply a muscle rub with lidocaine or lidocaine patch after heat or ice. 4. START diclofenac gel up to 4 times daily for pain.   5. Hand/wrist Xray 6. Follow up 2-4 weeks prn.     Relevant Medications   diclofenac  sodium (VOLTAREN) 1 % GEL   Skin rash     Stable, improved per patient's caregiver report.  No complications at this time.  Improved with family member's steroid cream.  - Rx provided for triamcinolone ointment.  Use up to 7 days, then stop. - Follow-up prn worsening    Relevant Medications   triamcinolone (KENALOG) 0.025 % ointment      Meds ordered this encounter  Medications  . diclofenac sodium (VOLTAREN) 1 % GEL    Sig: Apply 2 g topically 4 (four) times daily.    Dispense:  100 g    Refill:  1    Order Specific Question:   Supervising Provider    Answer:   Smitty CordsKARAMALEGOS, ALEXANDER J [[id number]]  . triamcinolone (KENALOG) 0.025 % ointment    Sig: Apply 1 application topically 2 (two) times daily.    Dispense:  30 g    Refill:  0    Order Specific Question:   Supervising Provider    Answer:   Smitty CordsKARAMALEGOS, ALEXANDER J [[id number]]    Follow up plan: Return if symptoms worsen or fail to improve.  Wilhelmina McardleLauren Vuong Musa, DNP, AGPCNP-BC Adult Gerontology Primary Care Nurse Practitioner [facility] 05/14/2019, 3:09 PM

## 2019-05-20 ENCOUNTER — Encounter: Payer: Self-pay | Admitting: Nurse Practitioner

## 2020-10-20 IMAGING — CT CT HEAD W/O CM
4 of 5 series · 15 of 47 positions shown, 17 images · non-contrast
Comparison: June 20, 2018

CLINICAL DATA: Left-sided weakness. Dementia. Previous subdural
hematomas

EXAM:
CT HEAD WITHOUT CONTRAST
TECHNIQUE: Contiguous axial images were obtained from the base of the skull
through the vertex without intravenous contrast.

[Series 2: head wo · axial · 0.39mm/px · z∈[+390,+510]mm · 5 of 36 slices shown, 7 images (1 of 2)]
[im 6/36  brain]
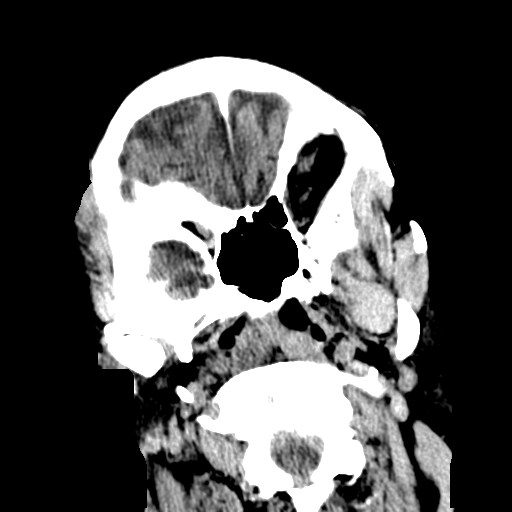
[im 6/36  bone]
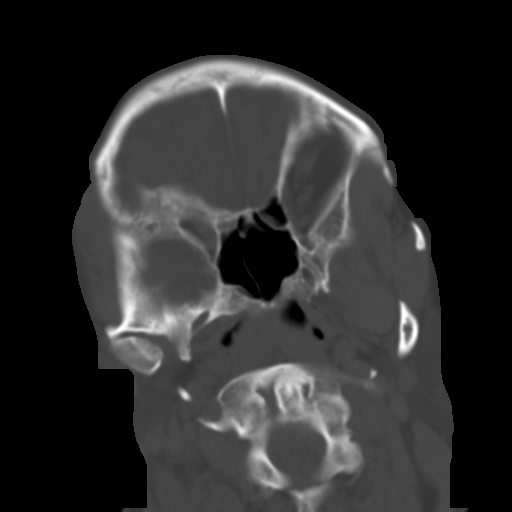
[im 12/36  brain]
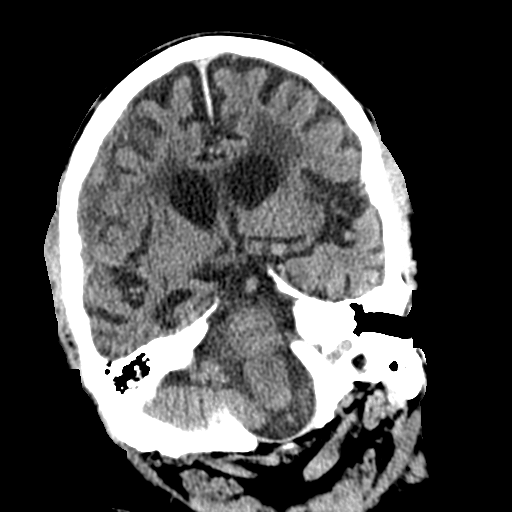
[im 18/36  brain]
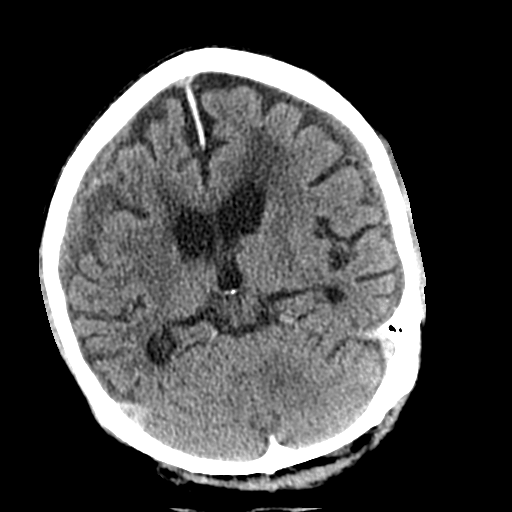
[im 24/36  brain]
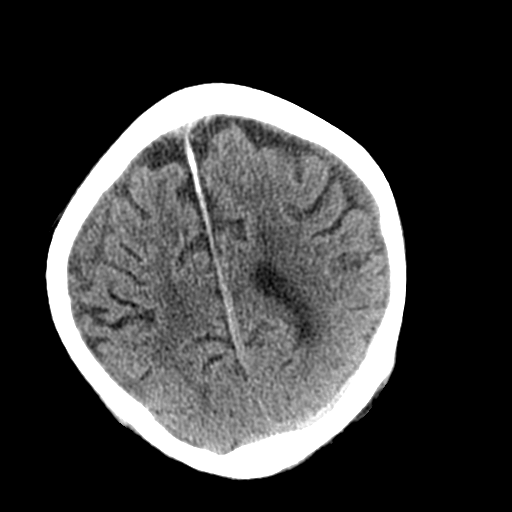
[im 30/36  brain]
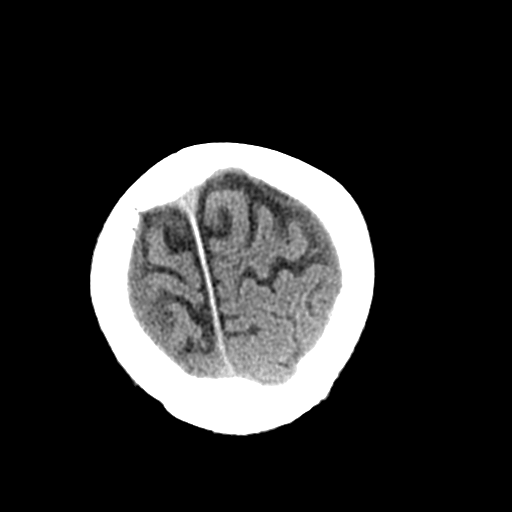
[im 30/36  bone]
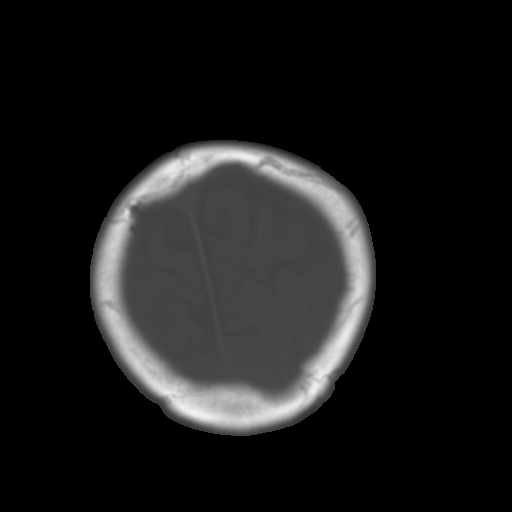

[Series 4: head wo · axial · 0.33mm/px · z∈[+352,+432]mm · 4 of 34 slices shown (2 of 2)]
[im 6/34  brain]
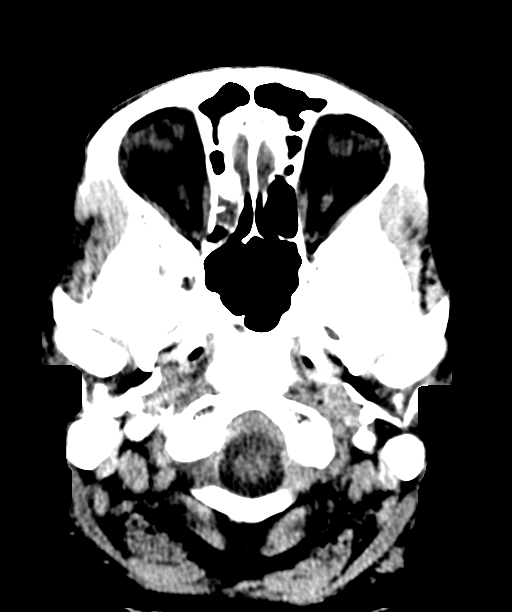
[im 12/34  brain]
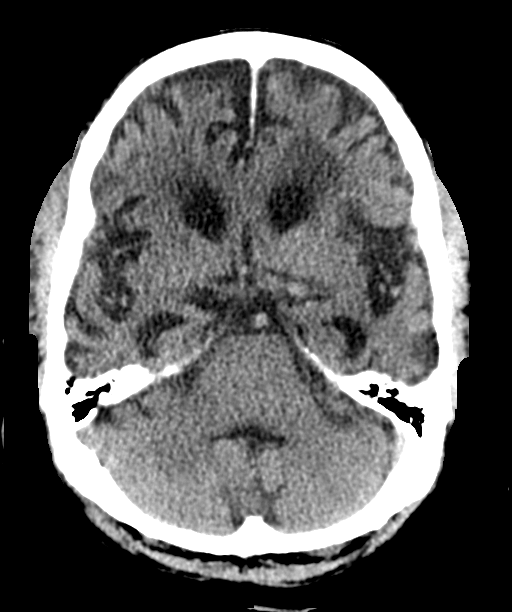
[im 17/34  brain]
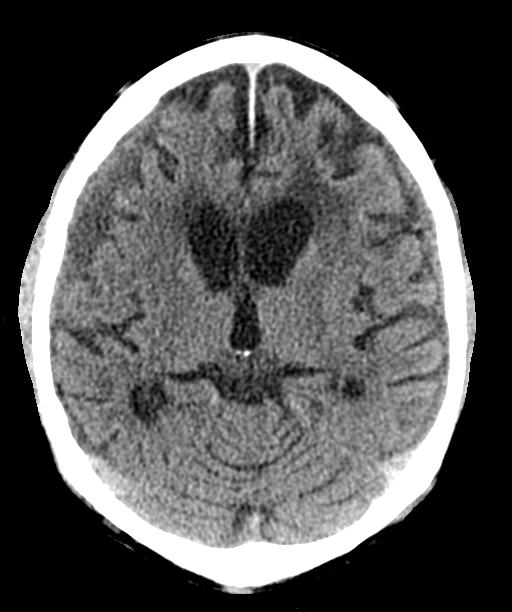
[im 23/34  brain]
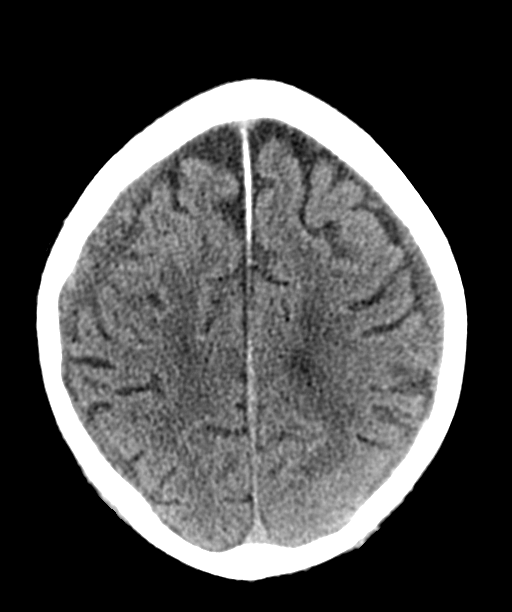

[Series 6: coronal soft tissue · coronal · 0.33mm/px · 3 of 65 slices shown]
[im 22/65  brain]
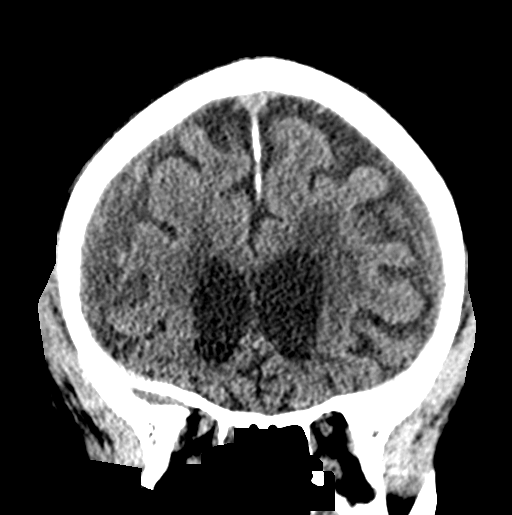
[im 29/65  brain]
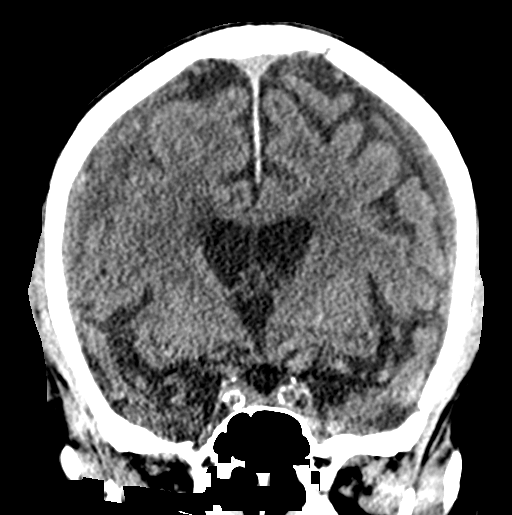
[im 36/65  brain]
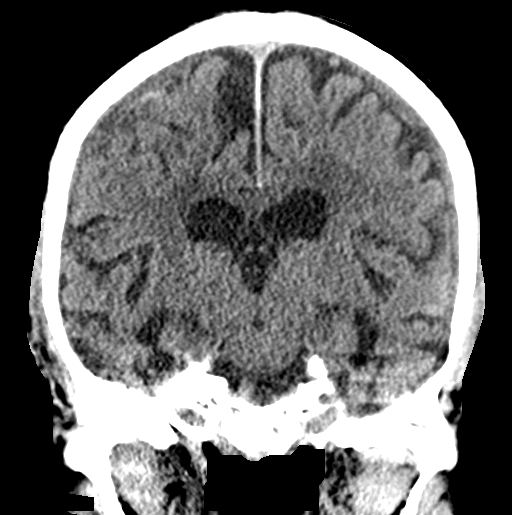

[Series 7: sagittal soft tissue · sagittal · 0.33mm/px · 3 of 54 slices shown]
[im 20/54  brain]
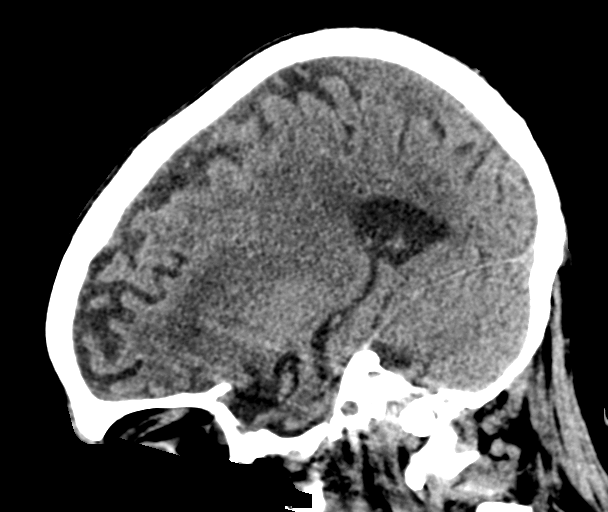
[im 27/54  brain]
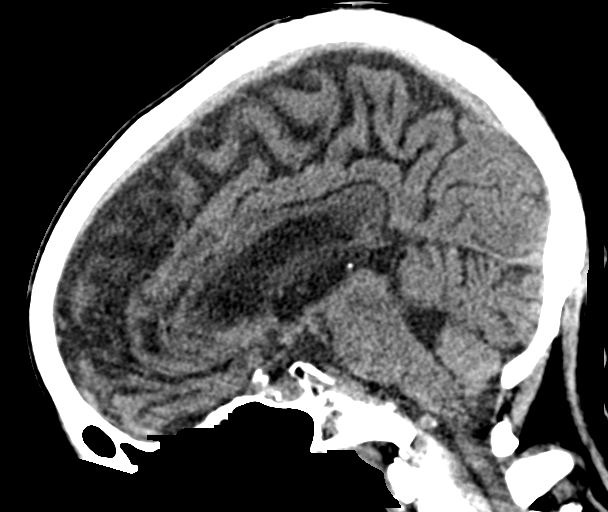
[im 34/54  brain]
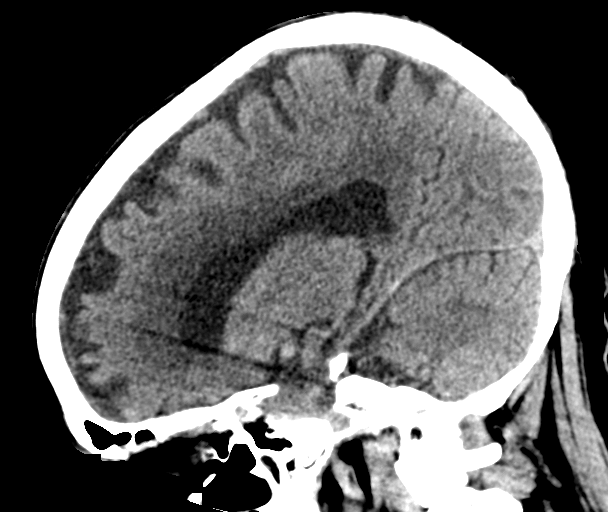

[15 of 47 positions shown; findings below may reference images not displayed]

FINDINGS: Brain: Moderate diffuse atrophy is noted. There are bilateral
frontal and parietal region subdural hematomas with mixed
attenuation consistent with subacute to chronic hemorrhage. The
subdural hematomas overall are smaller compared to most recent
study. The subdural hematoma in the right frontal region currently
measures 1.5 cm in thickness, compared to a measured maximum
thickness of 2.6 cm on the right on prior study. There is less mass
effect on the right frontal lobe compared to the previous study. The
maximum thickness of the subdural hematoma on the left in the
frontal region is currently measured at 7 mm compared to a
measurement on the right of 1.2 cm on the previous study. There is
less mass effect on the right frontal lobe currently compared to the
previous study. No new extra-axial fluid collections are evident.
There is no midline shift. There is no intra-axial mass or
intra-axial hemorrhage evident. No acute appearing hemorrhage is
noted in the subdural collections. There is patchy small vessel
disease in the centra semiovale bilaterally. No acute appearing
infarct is demonstrable on this current examination.

Vascular: There is no hyperdense vessel. There is calcification in
the carotid siphon regions bilaterally.

Skull: The bony calvarium appears intact.

Sinuses/Orbits: There is opacification in the visualized left
maxillary antrum. There is mucosal thickening in several ethmoid air
cells with opacification in several posterior right ethmoid air
cells. Visualized orbits appear symmetric bilaterally.

Other: Mastoid air cells are clear.
IMPRESSION: 1. Subacute appearing subdural hematomas bilaterally, smaller and
demonstrating less mass effect on the respective frontal lobes
compared to approximately 2 months prior. No new hemorrhage evident.
No intra-axial hemorrhage or mass. There is underlying atrophy with
periventricular small vessel disease. No acute infarct is
demonstrable on this current examination. No midline shift.

2.  There are foci of arterial vascular calcification.

3.  Multifocal paranasal sinus disease noted.

## 2020-11-22 DEATH — deceased

## 2021-07-20 IMAGING — CR RIGHT WRIST - 2 VIEW
2 series · 2 of 2 positions shown · non-contrast
Comparison: Right hand radiographs-earlier same day

CLINICAL DATA: Right hand and wrist pain and swelling for the past
2-3 days. Patient with dementia and inability to tolerate
positioning for the examination.

EXAM:
RIGHT WRIST - 2 VIEW

[wrist pa]
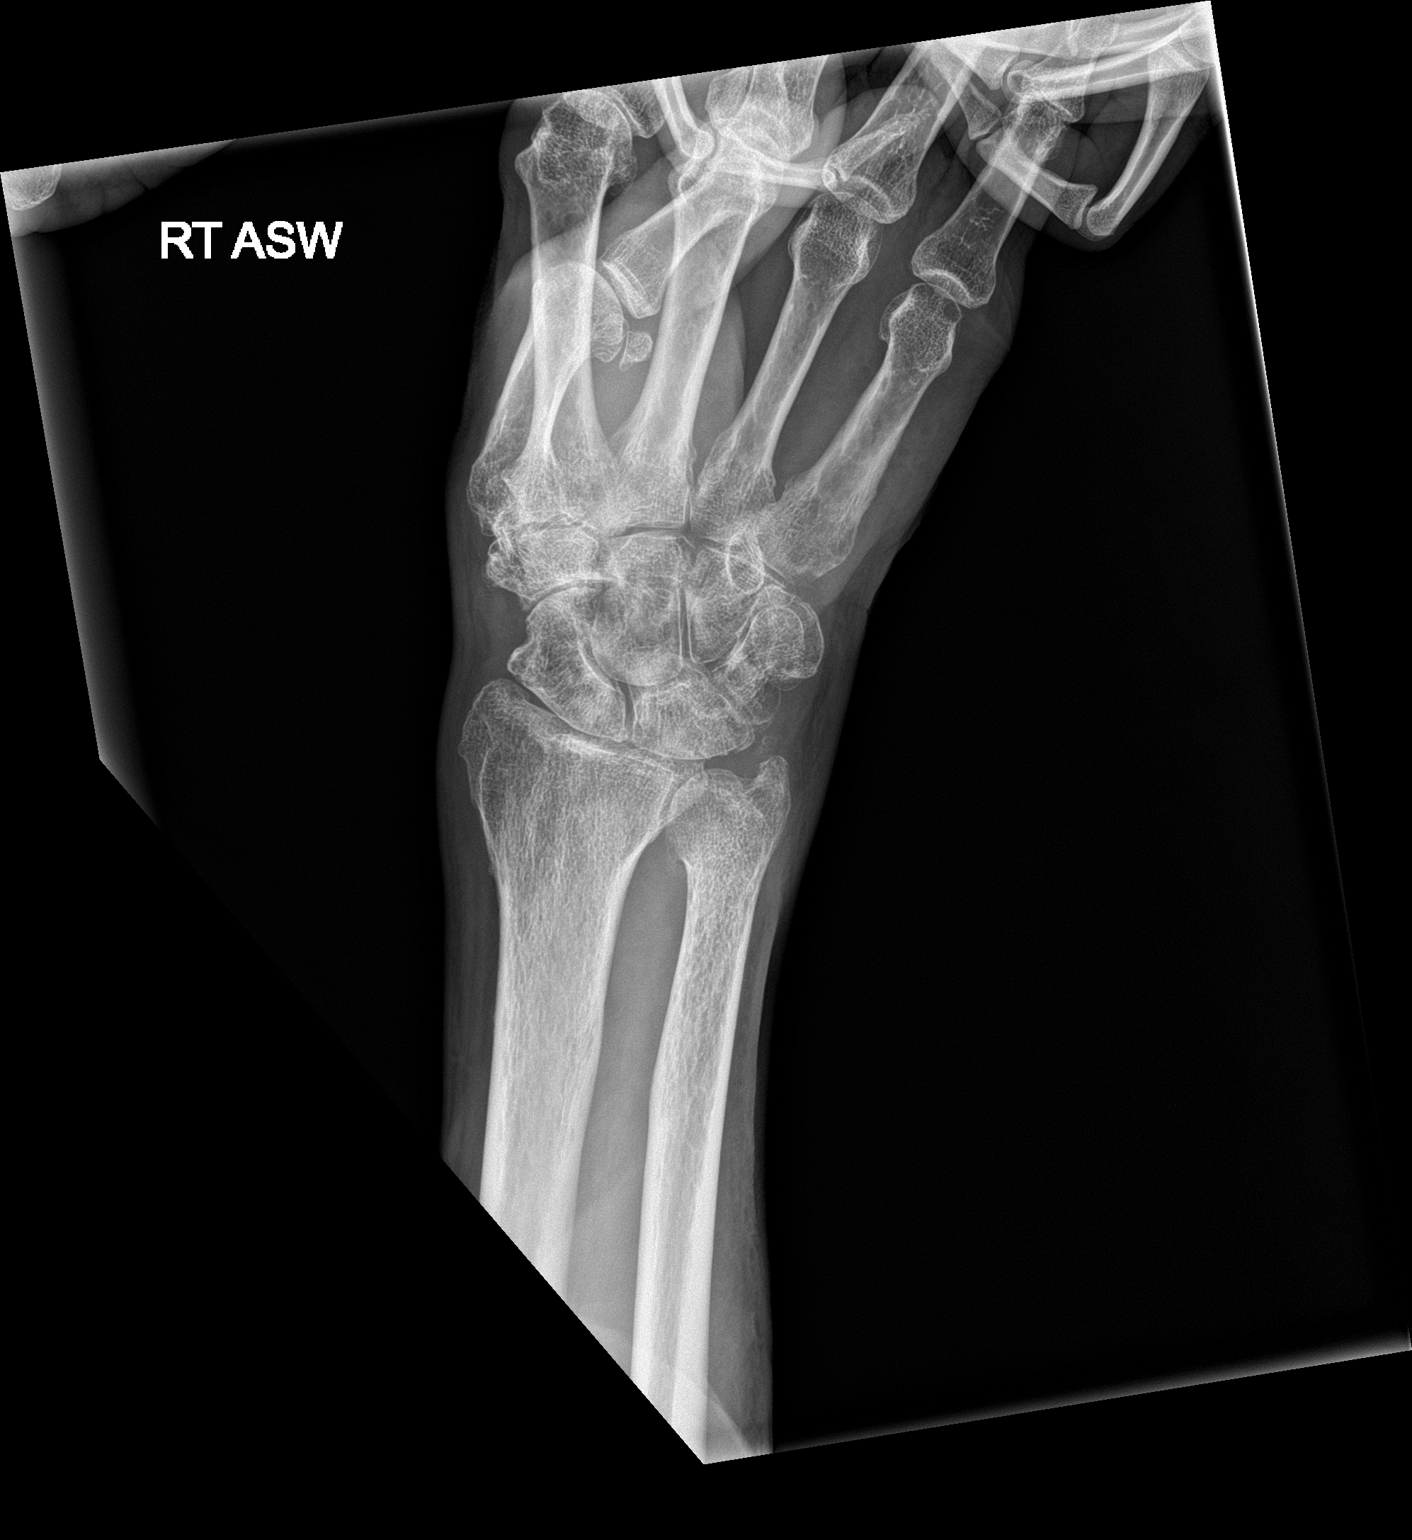

[wrist lat]
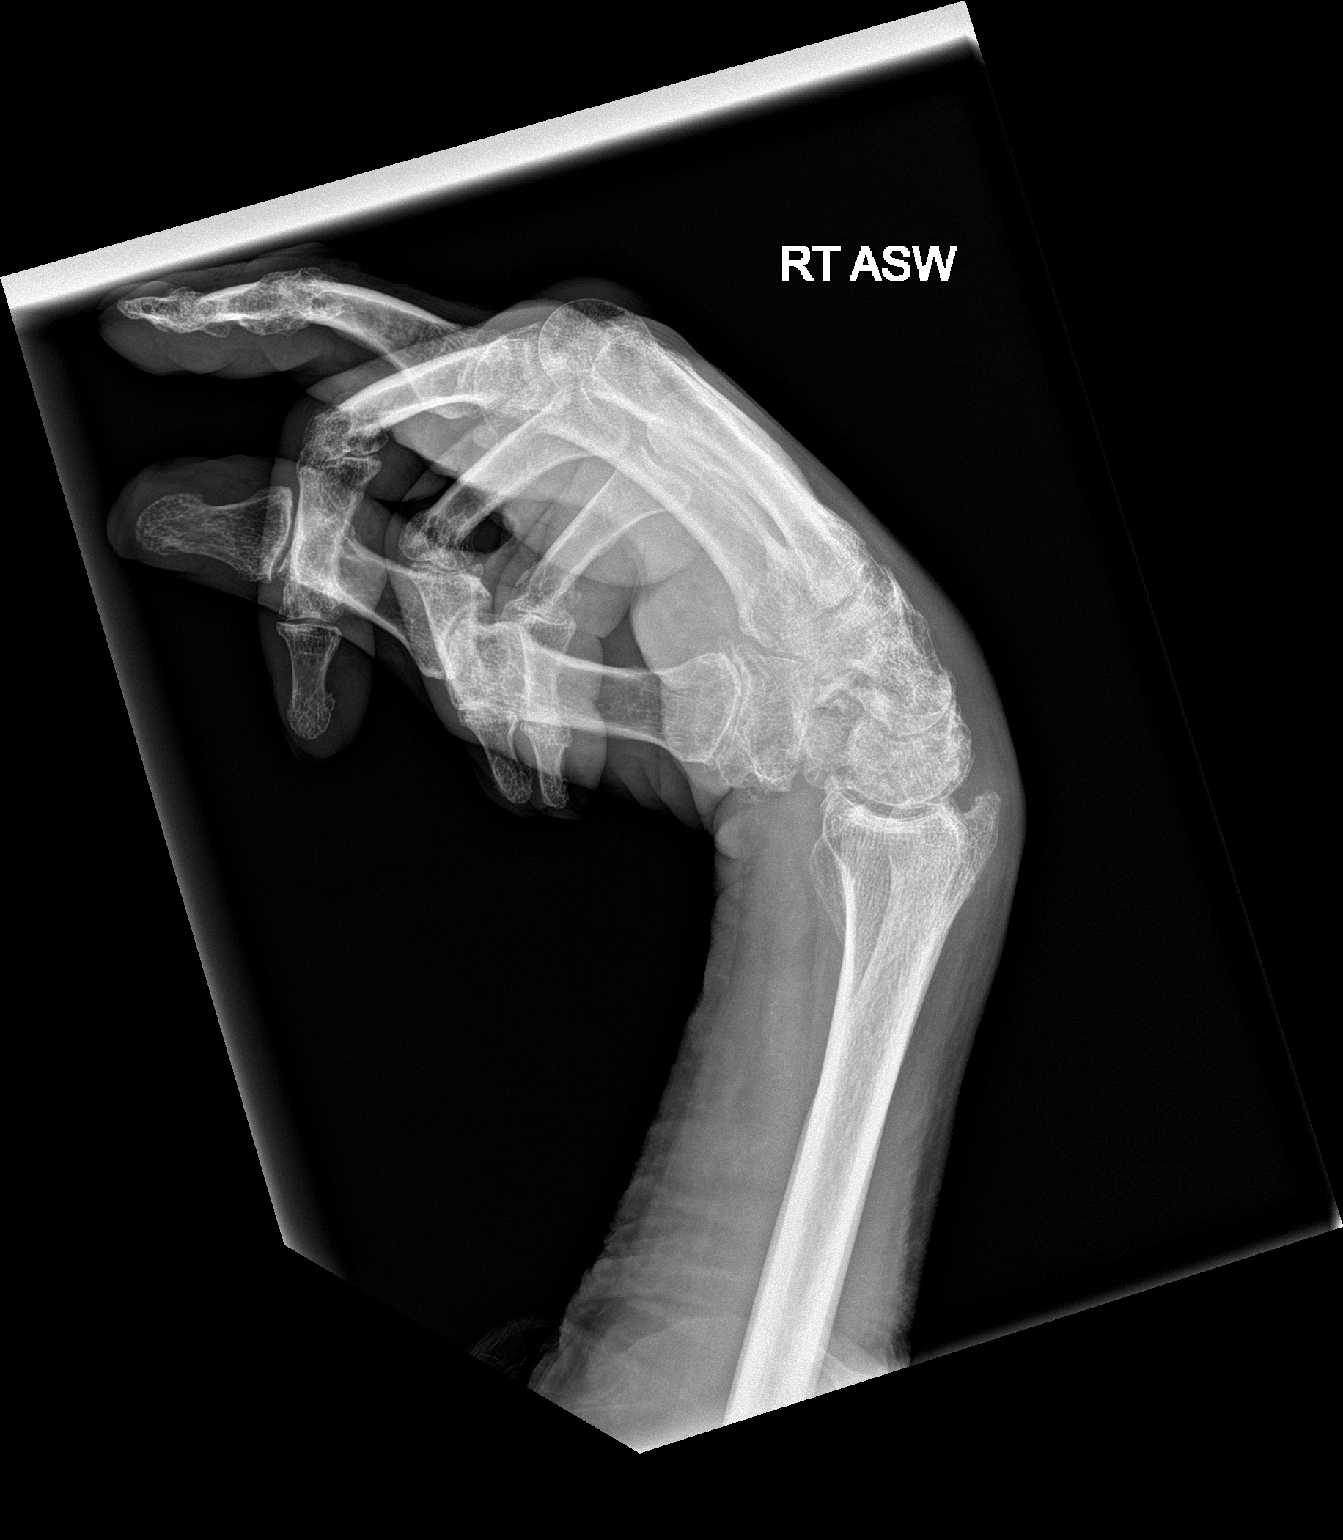

[2 of 2 positions shown; findings below may reference images not displayed]

FINDINGS: Osteopenia. [place] adjacent to the ulnar styloid process likely
represents sequela of remote ulnar styloid process fracture. No
definite acute fracture or dislocation. Degenerative change
involving the STT joints of the base of the thumb. Remaining joint
spaces appear grossly preserved. No definite erosions. No evidence
of chondrocalcinosis. Distal vascular calcifications. Regional soft
tissues appear otherwise normal.
IMPRESSION: 1. Osteopenia without definitive acute fracture on this degraded
examination.
2. Degenerative change involving the STT joints of base of thumb.
3. Old ulnar styloid process fracture.
